# Patient Record
Sex: Male | Born: 1955 | Race: White | Hispanic: No | Marital: Married | Smoking: Current every day smoker
Health system: Southern US, Community
[De-identification: ages and names within clinical notes are randomized; demographics above are authoritative.]

## PROBLEM LIST (undated history)

## (undated) DIAGNOSIS — E785 Hyperlipidemia, unspecified: Secondary | ICD-10-CM

## (undated) DIAGNOSIS — J449 Chronic obstructive pulmonary disease, unspecified: Secondary | ICD-10-CM

## (undated) DIAGNOSIS — N200 Calculus of kidney: Secondary | ICD-10-CM

## (undated) DIAGNOSIS — W3400XA Accidental discharge from unspecified firearms or gun, initial encounter: Secondary | ICD-10-CM

## (undated) DIAGNOSIS — T7840XA Allergy, unspecified, initial encounter: Secondary | ICD-10-CM

## (undated) DIAGNOSIS — R569 Unspecified convulsions: Secondary | ICD-10-CM

## (undated) DIAGNOSIS — K219 Gastro-esophageal reflux disease without esophagitis: Secondary | ICD-10-CM

## (undated) DIAGNOSIS — F17200 Nicotine dependence, unspecified, uncomplicated: Secondary | ICD-10-CM

## (undated) DIAGNOSIS — M1990 Unspecified osteoarthritis, unspecified site: Secondary | ICD-10-CM

## (undated) DIAGNOSIS — I739 Peripheral vascular disease, unspecified: Secondary | ICD-10-CM

## (undated) DIAGNOSIS — S0193XA Puncture wound without foreign body of unspecified part of head, initial encounter: Secondary | ICD-10-CM

## (undated) DIAGNOSIS — I10 Essential (primary) hypertension: Secondary | ICD-10-CM

## (undated) DIAGNOSIS — C801 Malignant (primary) neoplasm, unspecified: Secondary | ICD-10-CM

## (undated) DIAGNOSIS — M795 Residual foreign body in soft tissue: Secondary | ICD-10-CM

## (undated) HISTORY — PX: POLYPECTOMY: SHX149

## (undated) HISTORY — DX: Malignant (primary) neoplasm, unspecified: C80.1

## (undated) HISTORY — DX: Gastro-esophageal reflux disease without esophagitis: K21.9

## (undated) HISTORY — DX: Puncture wound without foreign body of unspecified part of head, initial encounter: S01.93XA

## (undated) HISTORY — DX: Unspecified osteoarthritis, unspecified site: M19.90

## (undated) HISTORY — DX: Nicotine dependence, unspecified, uncomplicated: F17.200

## (undated) HISTORY — DX: Calculus of kidney: N20.0

## (undated) HISTORY — DX: Allergy, unspecified, initial encounter: T78.40XA

## (undated) HISTORY — DX: Unspecified convulsions: R56.9

## (undated) HISTORY — DX: Peripheral vascular disease, unspecified: I73.9

## (undated) HISTORY — DX: Hyperlipidemia, unspecified: E78.5

## (undated) HISTORY — DX: Chronic obstructive pulmonary disease, unspecified: J44.9

## (undated) HISTORY — DX: Residual foreign body in soft tissue: M79.5

## (undated) HISTORY — PX: NASAL SINUS SURGERY: SHX719

## (undated) HISTORY — DX: Accidental discharge from unspecified firearms or gun, initial encounter: W34.00XA

## (undated) HISTORY — PX: COLONOSCOPY: SHX174

---

## 1988-12-30 HISTORY — PX: SALIVARY GLAND SURGERY: SHX768

## 2004-10-31 ENCOUNTER — Ambulatory Visit: Payer: Self-pay | Admitting: Internal Medicine

## 2005-02-12 ENCOUNTER — Ambulatory Visit: Payer: Self-pay | Admitting: Internal Medicine

## 2006-04-22 ENCOUNTER — Ambulatory Visit: Payer: Self-pay | Admitting: Internal Medicine

## 2006-05-05 ENCOUNTER — Ambulatory Visit: Payer: Self-pay | Admitting: Internal Medicine

## 2006-06-16 ENCOUNTER — Ambulatory Visit: Payer: Self-pay | Admitting: Internal Medicine

## 2006-10-21 ENCOUNTER — Ambulatory Visit: Payer: Self-pay | Admitting: Internal Medicine

## 2006-12-24 ENCOUNTER — Ambulatory Visit: Payer: Self-pay | Admitting: Internal Medicine

## 2006-12-24 LAB — CONVERTED CEMR LAB
BUN: 13 mg/dL (ref 6–23)
CO2: 29 meq/L (ref 19–32)
Calcium: 9.5 mg/dL (ref 8.4–10.5)
Chloride: 105 meq/L (ref 96–112)
Creatinine, Ser: 0.9 mg/dL (ref 0.4–1.5)
GFR calc non Af Amer: 96 mL/min
Glomerular Filtration Rate, Af Am: 116 mL/min/{1.73_m2}
Glucose, Bld: 102 mg/dL — ABNORMAL HIGH (ref 70–99)
Potassium: 4.5 meq/L (ref 3.5–5.1)
Sodium: 141 meq/L (ref 135–145)

## 2007-05-01 ENCOUNTER — Ambulatory Visit: Payer: Self-pay | Admitting: Internal Medicine

## 2007-05-01 LAB — CONVERTED CEMR LAB
ALT: 26 units/L (ref 0–40)
AST: 23 units/L (ref 0–37)
Albumin: 4.2 g/dL (ref 3.5–5.2)
Alkaline Phosphatase: 54 units/L (ref 39–117)
BUN: 10 mg/dL (ref 6–23)
Basophils Absolute: 0.1 10*3/uL (ref 0.0–0.1)
Basophils Relative: 1.1 % — ABNORMAL HIGH (ref 0.0–1.0)
Bilirubin, Direct: 0.1 mg/dL (ref 0.0–0.3)
CO2: 30 meq/L (ref 19–32)
Calcium: 9.6 mg/dL (ref 8.4–10.5)
Chloride: 107 meq/L (ref 96–112)
Cholesterol: 157 mg/dL (ref 0–200)
Creatinine, Ser: 1 mg/dL (ref 0.4–1.5)
Eosinophils Absolute: 0.6 10*3/uL (ref 0.0–0.6)
Eosinophils Relative: 6.7 % — ABNORMAL HIGH (ref 0.0–5.0)
GFR calc Af Amer: 103 mL/min
GFR calc non Af Amer: 85 mL/min
Glucose, Bld: 81 mg/dL (ref 70–99)
HCT: 44 % (ref 39.0–52.0)
HDL: 38.9 mg/dL — ABNORMAL LOW (ref >39.0)
Hemoglobin: 15.2 g/dL (ref 13.0–17.0)
LDL Cholesterol: 95 mg/dL (ref 0–99)
Lymphocytes Relative: 25.1 % (ref 12.0–46.0)
MCHC: 34.5 g/dL (ref 30.0–36.0)
MCV: 95.3 fL (ref 78.0–100.0)
Monocytes Absolute: 0.8 10*3/uL — ABNORMAL HIGH (ref 0.2–0.7)
Monocytes Relative: 8.6 % (ref 3.0–11.0)
Neutro Abs: 5.1 10*3/uL (ref 1.4–7.7)
Neutrophils Relative %: 58.5 % (ref 43.0–77.0)
Platelets: 187 10*3/uL (ref 150–400)
Potassium: 4 meq/L (ref 3.5–5.1)
RBC: 4.62 M/uL (ref 4.22–5.81)
RDW: 12.4 % (ref 11.5–14.6)
Sodium: 146 meq/L — ABNORMAL HIGH (ref 135–145)
TSH: 1.79 microintl units/mL (ref 0.35–5.50)
Total Bilirubin: 0.6 mg/dL (ref 0.3–1.2)
Total CHOL/HDL Ratio: 4
Total Protein: 7.2 g/dL (ref 6.0–8.3)
Triglycerides: 116 mg/dL (ref 0–149)
VLDL: 23 mg/dL (ref 0–40)
WBC: 8.8 10*3/uL (ref 4.5–10.5)

## 2007-05-08 ENCOUNTER — Ambulatory Visit: Payer: Self-pay | Admitting: Internal Medicine

## 2007-06-29 ENCOUNTER — Ambulatory Visit: Payer: Self-pay | Admitting: Internal Medicine

## 2007-06-30 LAB — HM COLONOSCOPY

## 2007-07-10 ENCOUNTER — Encounter: Payer: Self-pay | Admitting: Internal Medicine

## 2007-07-10 ENCOUNTER — Ambulatory Visit: Payer: Self-pay | Admitting: Internal Medicine

## 2007-11-04 ENCOUNTER — Telehealth: Payer: Self-pay | Admitting: Internal Medicine

## 2008-03-28 ENCOUNTER — Ambulatory Visit: Payer: Self-pay | Admitting: Internal Medicine

## 2008-03-28 DIAGNOSIS — I10 Essential (primary) hypertension: Secondary | ICD-10-CM | POA: Insufficient documentation

## 2008-04-01 ENCOUNTER — Telehealth: Payer: Self-pay | Admitting: Internal Medicine

## 2008-04-01 ENCOUNTER — Ambulatory Visit: Payer: Self-pay | Admitting: Internal Medicine

## 2008-04-15 ENCOUNTER — Ambulatory Visit: Payer: Self-pay | Admitting: Internal Medicine

## 2008-05-09 ENCOUNTER — Telehealth: Payer: Self-pay | Admitting: Internal Medicine

## 2008-07-08 ENCOUNTER — Ambulatory Visit: Payer: Self-pay | Admitting: Internal Medicine

## 2008-07-11 ENCOUNTER — Telehealth: Payer: Self-pay | Admitting: Internal Medicine

## 2008-08-03 ENCOUNTER — Ambulatory Visit: Payer: Self-pay | Admitting: Internal Medicine

## 2008-08-16 ENCOUNTER — Telehealth: Payer: Self-pay | Admitting: Internal Medicine

## 2008-08-17 ENCOUNTER — Ambulatory Visit: Payer: Self-pay | Admitting: Internal Medicine

## 2008-09-19 ENCOUNTER — Ambulatory Visit: Payer: Self-pay | Admitting: Internal Medicine

## 2008-09-19 ENCOUNTER — Encounter: Payer: Self-pay | Admitting: Internal Medicine

## 2008-10-17 ENCOUNTER — Telehealth: Payer: Self-pay | Admitting: Internal Medicine

## 2008-12-09 ENCOUNTER — Ambulatory Visit: Payer: Self-pay | Admitting: Internal Medicine

## 2009-08-11 ENCOUNTER — Ambulatory Visit: Payer: Self-pay | Admitting: Internal Medicine

## 2009-09-12 ENCOUNTER — Ambulatory Visit: Payer: Self-pay | Admitting: Internal Medicine

## 2009-09-12 DIAGNOSIS — F17200 Nicotine dependence, unspecified, uncomplicated: Secondary | ICD-10-CM | POA: Insufficient documentation

## 2009-09-12 HISTORY — DX: Nicotine dependence, unspecified, uncomplicated: F17.200

## 2009-09-14 ENCOUNTER — Ambulatory Visit: Payer: Self-pay

## 2009-09-14 ENCOUNTER — Encounter: Payer: Self-pay | Admitting: Internal Medicine

## 2009-09-15 ENCOUNTER — Ambulatory Visit: Payer: Self-pay | Admitting: Internal Medicine

## 2010-03-01 ENCOUNTER — Ambulatory Visit: Payer: Self-pay | Admitting: Internal Medicine

## 2010-03-02 LAB — CONVERTED CEMR LAB
BUN: 17 mg/dL (ref 6–23)
Basophils Relative: 0.1 % (ref 0.0–3.0)
Bilirubin, Direct: 0.1 mg/dL (ref 0.0–0.3)
Cholesterol: 145 mg/dL (ref 0–200)
Eosinophils Absolute: 0.6 10*3/uL (ref 0.0–0.7)
GFR calc non Af Amer: 108.01 mL/min (ref >60)
Glucose, Bld: 90 mg/dL (ref 70–99)
HDL: 42.5 mg/dL (ref >39.00)
LDL Cholesterol: 90 mg/dL (ref 0–99)
MCHC: 34 g/dL (ref 30.0–36.0)
MCV: 98.4 fL (ref 78.0–100.0)
Monocytes Absolute: 0.9 10*3/uL (ref 0.1–1.0)
Neutro Abs: 7.5 10*3/uL (ref 1.4–7.7)
Neutrophils Relative %: 66.6 % (ref 43.0–77.0)
RBC: 4.21 M/uL — ABNORMAL LOW (ref 4.22–5.81)
RDW: 12.4 % (ref 11.5–14.6)
Sodium: 144 meq/L (ref 135–145)
TSH: 1.76 microintl units/mL (ref 0.35–5.50)
Total CHOL/HDL Ratio: 3
Total Protein: 7.7 g/dL (ref 6.0–8.3)
Triglycerides: 61 mg/dL (ref 0.0–149.0)

## 2010-03-07 ENCOUNTER — Ambulatory Visit: Payer: Self-pay | Admitting: Internal Medicine

## 2010-04-03 ENCOUNTER — Encounter: Payer: Self-pay | Admitting: Internal Medicine

## 2010-06-18 ENCOUNTER — Ambulatory Visit: Payer: Self-pay | Admitting: Internal Medicine

## 2010-07-11 ENCOUNTER — Ambulatory Visit: Payer: Self-pay | Admitting: Internal Medicine

## 2010-07-27 ENCOUNTER — Telehealth: Payer: Self-pay | Admitting: Internal Medicine

## 2010-10-04 ENCOUNTER — Encounter: Payer: Self-pay | Admitting: Internal Medicine

## 2010-11-28 ENCOUNTER — Ambulatory Visit: Payer: Self-pay | Admitting: Internal Medicine

## 2010-11-28 DIAGNOSIS — J019 Acute sinusitis, unspecified: Secondary | ICD-10-CM | POA: Insufficient documentation

## 2011-01-10 ENCOUNTER — Ambulatory Visit
Admission: RE | Admit: 2011-01-10 | Discharge: 2011-01-10 | Payer: Self-pay | Source: Home / Self Care | Attending: Family Medicine | Admitting: Family Medicine

## 2011-01-20 ENCOUNTER — Encounter: Payer: Self-pay | Admitting: Internal Medicine

## 2011-01-29 NOTE — Procedures (Signed)
Summary: Colon/[facility]  Colon/[facility]   Imported By: Lester Sayner 03/02/2010 08:41:23  _____________________________________________________________________  External Attachment:    Type:   Image     Comment:   External Document
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
## 2011-01-29 NOTE — Consult Note (Signed)
Summary: [facility]  [facility]   Imported By: Maryln Gottron 04/18/2010 14:38:07  _____________________________________________________________________  External Attachment:    Type:   Image     Comment:   External Document

## 2011-01-29 NOTE — Assessment & Plan Note (Signed)
Summary: POST HOSP F/U (HTN, CP) // RS   Vital Signs:  Patient profile:   55 year old male Weight:      210 pounds Temp:     98.4 degrees F oral Pulse rate:   84 / minute Pulse rhythm:   regular Resp:     12 per minute BP sitting:   124 / 78  (left arm) Cuff size:   regular  Vitals Entered By: Gladis Riffle, RN (July 11, 2010 8:53 AM) CC: hospital FU-was in [facility] for chest pain, only stayed overnight Is Patient Diabetic? No   CC:  hospital FU-was in [facility] for chest pain and only stayed overnight.  History of Present Illness:  Follow-Up Visit      This is a 55 year old man who presents for Follow-up visit.  The patient denies chest pain (except for hospitalization) and palpitations.  Since the last visit the patient notes a recent hospitilization.  The patient reports taking meds as prescribed.  When questioned about possible medication side effects, the patient notes none.   he does not know the results of hospitalization----we have called for results of hospital labs. says he had stress test at the hospital---says results normal  he currently feels well---multiple risk factors for CAD  All other systems reviewed and were negative   Preventive Screening-Counseling & Management  Alcohol-Tobacco     Smoking Status: current     Smoking Cessation Counseling: yes     Packs/Day: 1.5  Current Medications (verified): 1)  Benicar Hct 40-25 Mg  Tabs (Olmesartan Medoxomil-Hctz) .... Take 1/2 Tab By Mouth Daily 2)  Nystatin-Triamcinolone 100000-0.1 Unit/gm-% Crea (Nystatin-Triamcinolone) .... Apply Two Times A Day As Needed  Allergies: 1)  ! Lisinopril (Lisinopril)  Physical Exam  General:  Well-developed,well-nourished,in no acute distress; alert,appropriate and cooperative throughout examination; blood pressure 140/84 Head:  normocephalic and atraumatic.   Eyes:  pupils equal and pupils round.   Ears:  R ear normal and L ear normal.   Neck:  No  deformities, masses, or tenderness noted. no bruits Chest Wall:  No deformities, masses, tenderness or gynecomastia noted. Lungs:  normal respiratory effort and no intercostal retractions.   Heart:  normal rate and regular rhythm.   Abdomen:  Bowel sounds positive,abdomen soft and non-tender without masses, organomegaly or hernias noted. Msk:  normal ROM and no joint tenderness.   Neurologic:  alert & oriented X3 and cranial nerves II-XII intact.     Impression & Recommendations:  Problem # 1:  CHEST PAIN (ICD-786.50) required hospitalization will get hospital records I'll review and decide whether further evaluation necessary  Complete Medication List: 1)  Benicar Hct 40-25 Mg Tabs (Olmesartan medoxomil-hctz) .... Take 1/2 tab by mouth daily 2)  Nystatin-triamcinolone 100000-0.1 Unit/gm-% Crea (Nystatin-triamcinolone) .... Apply two times a day as needed 3)  Aspirin 325 Mg Tabs (Aspirin) .... Take 1 tab by mouth every day  Patient Instructions: 1)  release of information for hospitalziation
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
## 2011-01-29 NOTE — Letter (Signed)
Summary: [facility]  [facility]   Imported By: Maryln Gottron 10/12/2010 15:53:14  _____________________________________________________________________  External Attachment:    Type:   Image     Comment:   External Document

## 2011-01-29 NOTE — Assessment & Plan Note (Signed)
Summary: CPX/PT FASTING/CJR   Vital Signs:  Patient profile:   55 year old male Height:      76 inches Weight:      212 pounds BMI:     25.90 Pulse rate:   80 / minute Pulse rhythm:   regular Resp:     14 per minute BP sitting:   138 / 82  (left arm) Cuff size:   regular  Vitals Entered By: Gladis Riffle, RN (March 01, 2010 8:08 AM) CC: cpx, fasting Is Patient Diabetic? No   CC:  cpx and fasting.  Preventive Screening-Counseling & Management  Alcohol-Tobacco     Smoking Status: current     Packs/Day: 1.5  Current Medications (verified): 1)  Benicar Hct 40-25 Mg  Tabs (Olmesartan Medoxomil-Hctz) .... Take 1/2 Tab By Mouth Daily 2)  Nystatin-Triamcinolone 100000-0.1 Unit/gm-% Crea (Nystatin-Triamcinolone) .... Apply Two Times A Day As Needed  Allergies: 1)  ! Lisinopril (Lisinopril)  Social History: Packs/Day:  1.5   Impression & Recommendations:  Problem # 1:  PREVENTIVE HEALTH CARE (ICD-V70.0) helath maint UTD smoking cessation discussed---he is not ready to commit advised daily exercise Orders: UA Dipstick w/Micro (automated) (81001) Venipuncture (16109) TLB-Lipid Panel (80061-LIPID) TLB-BMP (Basic Metabolic Panel-BMET) (80048-METABOL) TLB-CBC Platelet - w/Differential (85025-CBCD) TLB-Hepatic/Liver Function Pnl (80076-HEPATIC) TLB-TSH (Thyroid Stimulating Hormone) (84443-TSH) TLB-PSA (Prostate Specific Antigen) (84153-PSA)  Complete Medication List: 1)  Benicar Hct 40-25 Mg Tabs (Olmesartan medoxomil-hctz) .... Take 1/2 tab by mouth daily 2)  Nystatin-triamcinolone 100000-0.1 Unit/gm-% Crea (Nystatin-triamcinolone) .... Apply two times a day as needed  Patient Instructions: 1)  Please schedule a follow-up appointment in 6 months.   Immunization History:  Tetanus/Td Immunization History:    Tetanus/Td:  historical (12/30/2004)    Preventive Care Screening  Colonoscopy:    Date:  06/29/2004    Next Due:  06/2009    Results:  polyps       Preventive Care Screening  Colonoscopy:    Date:  06/29/2004    Next Due:  06/2009    Results:  polyps   Physical Exam General Appearance: well developed, well nourished, no acute distress Eyes: conjunctiva and lids normal, PERRL, EOMI, Ears, Nose, Mouth, Throat: TM clear, nares clear, oral exam WNL Neck: supple, no lymphadenopathy, no thyromegaly, no JVD Respiratory: clear to auscultation and percussion, respiratory effort normal Cardiovascular: regular rate and rhythm, S1-S2, no murmur, rub or gallop, no bruits, peripheral pulses normal and symmetric, no cyanosis, clubbing, edema or varicosities Chest: no scars, masses, tenderness; no asymmetry, skin changes, nipple discharge, no gynecomastia   Gastrointestinal: soft, non-tender; no hepatosplenomegaly, masses; active bowel sounds all quadrants,  no masses, tenderness, hemorrhoids  Genitourinary: no  prostate enlargement Lymphatic: no cervical, axillary or inguinal adenopathy Musculoskeletal: gait normal, muscle tone and strength WNL, no joint swelling, effusions, discoloration, crepitus  Skin: clear, good turgor, color WNL, no rashes, lesions, or ulcerations Neurologic: normal mental status, normal reflexes, normal strength, sensation, and motion Psychiatric: alert; oriented to person, place and time Other Exam:   Appended Document: Orders Update     Clinical Lists Changes  Observations: Added new observation of COMMENTS: Wynona Canes, CMA  March 01, 2010 11:59 AM  (03/01/2010 11:58) Added new observation of PH URINE: 6.0  (03/01/2010 11:58) Added new observation of SPEC GR URIN: 1.025  (03/01/2010 11:58) Added new observation of APPEARANCE U: Hazy  (03/01/2010 11:58) Added new observation of UA COLOR: yellow  (03/01/2010 11:58) Added new observation of WBC DIPSTK U: negative  (03/01/2010  11:58) Added new observation of NITRITE URN: negative  (03/01/2010 11:58) Added new observation of UROBILINOGEN: 0.2   (03/01/2010 11:58) Added new observation of PROTEIN, URN: 1+  (03/01/2010 11:58) Added new observation of BLOOD UR DIP: 2+  (03/01/2010 11:58) Added new observation of KETONES URN: negative  (03/01/2010 11:58) Added new observation of BILIRUBIN UR: 1+  (03/01/2010 11:58) Added new observation of GLUCOSE, URN: negative  (03/01/2010 11:58)      Laboratory Results   Urine Tests  Date/Time Recieved: March 01, 2010 11:59 AM  Date/Time Reported: March 01, 2010 11:59 AM   Routine Urinalysis   Color: yellow Appearance: Hazy Glucose: negative   (Normal Range: Negative) Bilirubin: 1+   (Normal Range: Negative) Ketone: negative   (Normal Range: Negative) Spec. Gravity: 1.025   (Normal Range: 1.003-1.035) Blood: 2+   (Normal Range: Negative) pH: 6.0   (Normal Range: 5.0-8.0) Protein: 1+   (Normal Range: Negative) Urobilinogen: 0.2   (Normal Range: 0-1) Nitrite: negative   (Normal Range: Negative) Leukocyte Esterace: negative   (Normal Range: Negative)    Comments: Wynona Canes, CMA  March 01, 2010 11:59 AM     Appended Document: CPX/PT FASTING/CJR call patient. has some blood in urine check CT abdomen and pelvis---with contrast---hematuria  Appended Document: CPX/PT FASTING/CJR Left message on machine. Pt to call back.   Appended Document: CPX/PT FASTING/CJR Patient notified. Will await when and where of scan.  order in process

## 2011-01-29 NOTE — Progress Notes (Signed)
Summary: REVIEW OF PAPERWORK?  Phone Note Call from Patient   Caller: Spouse   [phone] Summary of Call: Pts wife calling to inquire if Dr Cato Mulligan has had the opportunity to look over paperwork from [facility] (where pt was admitted ref HTN / CP)..... ?  Advise that she is anxious to find out if there is anything that the pt should or should not be doing.....?    Pts wife can be reached at [phone]  to advise  /  if you have any concerns or questions.  Initial call taken by: Debbra Riding,  July 27, 2010 9:38 AM  Follow-up for Phone Call        reviewed no concerns Follow-up by: Birdie Sons MD,  July 27, 2010 3:51 PM  Additional Follow-up for Phone Call Additional follow up Details #1::        Lft msg on v/m for pts wife... adv to c/b if needed.   Additional Follow-up by: Debbra Riding,  July 27, 2010 3:58 PM
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
## 2011-02-08 ENCOUNTER — Other Ambulatory Visit: Payer: Self-pay | Admitting: Internal Medicine

## 2011-02-22 ENCOUNTER — Other Ambulatory Visit (INDEPENDENT_AMBULATORY_CARE_PROVIDER_SITE_OTHER): Payer: PRIVATE HEALTH INSURANCE | Admitting: Internal Medicine

## 2011-02-22 ENCOUNTER — Ambulatory Visit (INDEPENDENT_AMBULATORY_CARE_PROVIDER_SITE_OTHER): Payer: PRIVATE HEALTH INSURANCE | Admitting: Family Medicine

## 2011-02-22 ENCOUNTER — Encounter: Payer: Self-pay | Admitting: Family Medicine

## 2011-02-22 VITALS — BP 126/78 | Temp 98.1°F | Wt 218.0 lb

## 2011-02-22 DIAGNOSIS — H4900 Third [oculomotor] nerve palsy, unspecified eye: Secondary | ICD-10-CM

## 2011-02-22 DIAGNOSIS — Z0000 Encounter for general adult medical examination without abnormal findings: Secondary | ICD-10-CM

## 2011-02-22 DIAGNOSIS — H02409 Unspecified ptosis of unspecified eyelid: Secondary | ICD-10-CM

## 2011-02-22 LAB — CBC WITH DIFFERENTIAL/PLATELET
Basophils Absolute: 0 10*3/uL (ref 0.0–0.1)
Basophils Relative: 0.4 % (ref 0.0–3.0)
Eosinophils Absolute: 0.6 10*3/uL (ref 0.0–0.7)
Lymphocytes Relative: 27.1 % (ref 12.0–46.0)
MCHC: 34.7 g/dL (ref 30.0–36.0)
MCV: 97.9 fl (ref 78.0–100.0)
Monocytes Absolute: 0.7 10*3/uL (ref 0.1–1.0)
Neutrophils Relative %: 57.8 % (ref 43.0–77.0)
Platelets: 180 10*3/uL (ref 150.0–400.0)
RDW: 13.3 % (ref 11.5–14.6)

## 2011-02-22 LAB — LIPID PANEL
HDL: 40.3 mg/dL (ref >39.00)
Total CHOL/HDL Ratio: 4
Triglycerides: 74 mg/dL (ref 0.0–149.0)
VLDL: 14.8 mg/dL (ref 0.0–40.0)

## 2011-02-22 LAB — BASIC METABOLIC PANEL
BUN: 15 mg/dL (ref 6–23)
CO2: 27 mEq/L (ref 19–32)
Chloride: 109 mEq/L (ref 96–112)
GFR: 95.15 mL/min (ref >60.00)

## 2011-02-22 LAB — HEPATIC FUNCTION PANEL
Bilirubin, Direct: 0.2 mg/dL (ref 0.0–0.3)
Total Bilirubin: 1 mg/dL (ref 0.3–1.2)
Total Protein: 7.4 g/dL (ref 6.0–8.3)

## 2011-02-22 LAB — POCT URINALYSIS DIPSTICK
Blood, UA: NEGATIVE
Ketones, UA: NEGATIVE
Spec Grav, UA: 1.025
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
## 2011-02-22 NOTE — Patient Instructions (Signed)
We will call you regarding CT head.

## 2011-02-24 ENCOUNTER — Encounter: Payer: Self-pay | Admitting: Family Medicine

## 2011-02-24 DIAGNOSIS — H4900 Third [oculomotor] nerve palsy, unspecified eye: Secondary | ICD-10-CM | POA: Insufficient documentation

## 2011-02-26 ENCOUNTER — Other Ambulatory Visit: Payer: Self-pay | Admitting: Family Medicine

## 2011-02-26 ENCOUNTER — Ambulatory Visit (INDEPENDENT_AMBULATORY_CARE_PROVIDER_SITE_OTHER)
Admission: RE | Admit: 2011-02-26 | Discharge: 2011-02-26 | Disposition: A | Discharge status: Home / Self Care | Payer: PRIVATE HEALTH INSURANCE | Source: Ambulatory Visit | Attending: Family Medicine | Admitting: Family Medicine

## 2011-02-26 DIAGNOSIS — H4900 Third [oculomotor] nerve palsy, unspecified eye: Secondary | ICD-10-CM

## 2011-02-26 HISTORY — DX: Essential (primary) hypertension: I10

## 2011-02-26 MED ORDER — IOHEXOL 300 MG/ML  SOLN
80.0000 mL | Freq: Once | INTRAMUSCULAR | Status: AC | PRN
  Administered 2011-02-26: 80 mL via INTRAVENOUS

## 2011-02-28 ENCOUNTER — Telehealth: Payer: Self-pay | Admitting: *Deleted

## 2011-02-28 NOTE — Progress Notes (Signed)
Quick Note:  LMTCB ______ 

## 2011-03-01 ENCOUNTER — Telehealth: Payer: Self-pay | Admitting: *Deleted

## 2011-03-01 NOTE — Progress Notes (Signed)
Quick Note:  Pt informed on home personally identified VM 2 times ______

## 2011-03-01 NOTE — Progress Notes (Signed)
Quick Note:  Pt informed 2 times on personally identified VM ______

## 2011-03-04 ENCOUNTER — Encounter: Payer: Self-pay | Admitting: Internal Medicine

## 2011-03-04 ENCOUNTER — Ambulatory Visit (INDEPENDENT_AMBULATORY_CARE_PROVIDER_SITE_OTHER): Payer: PRIVATE HEALTH INSURANCE | Admitting: Internal Medicine

## 2011-03-04 DIAGNOSIS — I779 Disorder of arteries and arterioles, unspecified: Secondary | ICD-10-CM | POA: Insufficient documentation

## 2011-03-04 DIAGNOSIS — E785 Hyperlipidemia, unspecified: Secondary | ICD-10-CM

## 2011-03-04 DIAGNOSIS — Z0000 Encounter for general adult medical examination without abnormal findings: Secondary | ICD-10-CM

## 2011-03-04 DIAGNOSIS — H4900 Third [oculomotor] nerve palsy, unspecified eye: Secondary | ICD-10-CM

## 2011-03-04 HISTORY — DX: Disorder of arteries and arterioles, unspecified: I77.9

## 2011-03-04 HISTORY — DX: Hyperlipidemia, unspecified: E78.5

## 2011-03-04 MED ORDER — SIMVASTATIN 20 MG PO TABS: 20.0000 mg | ORAL_TABLET | Freq: Every evening | ORAL | Status: DC

## 2011-03-04 NOTE — Assessment & Plan Note (Signed)
Needs statin therapy and risk factor modification Stop smoking

## 2011-03-04 NOTE — Assessment & Plan Note (Signed)
Continues to have left eyelid close spontaneously---refer neurology

## 2011-03-04 NOTE — Assessment & Plan Note (Signed)
Needs treatement Side effects discussed

## 2011-03-04 NOTE — Progress Notes (Addendum)
  Subjective:    Patient ID: Darin Hunter, male    DOB: 1956/04/15, 55 y.o.   MRN: [id number]  HPI CPX  Past Medical History  Diagnosis Date  . Hypertension    No past surgical history on file.  reports that he has been smoking.  He does not have any smokeless tobacco history on file. His alcohol and drug histories not on file. family history is not on file.    Allergies  Allergen Reactions  . Lisinopril     REACTION: facial swelling?    Review of Systems  patient denies chest pain, shortness of breath, orthopnea. Denies lower extremity edema, abdominal pain, change in appetite, change in bowel movements. Patient denies rashes, musculoskeletal complaints. No other specific complaints in a complete review of systems.      Objective:   Physical Exam well-developed well-nourished male in no acute distress. HEENT exam atraumatic, normocephalic, neck supple without jugular venous distention. Chest clear to auscultation cardiac exam S1-S2 are regular. Abdominal exam overweight with bowel sounds, soft and nontender. Extremities no edema. Neurologic exam is alert with a normal gait.    Assessment & Plan:  Well visit Needs to quit smoking He has several other medical problems---adressed individually

## 2011-03-14 NOTE — Telephone Encounter (Signed)
Opened encounter by mistake.

## 2011-06-04 ENCOUNTER — Other Ambulatory Visit (INDEPENDENT_AMBULATORY_CARE_PROVIDER_SITE_OTHER): Payer: PRIVATE HEALTH INSURANCE

## 2011-06-04 DIAGNOSIS — E785 Hyperlipidemia, unspecified: Secondary | ICD-10-CM

## 2011-06-04 LAB — LIPID PANEL: Cholesterol: 134 mg/dL (ref 0–200)

## 2011-06-04 LAB — HEPATIC FUNCTION PANEL
ALT: 22 U/L (ref 0–53)
Alkaline Phosphatase: 55 U/L (ref 39–117)
Bilirubin, Direct: 0.2 mg/dL (ref 0.0–0.3)
Total Protein: 7.4 g/dL (ref 6.0–8.3)

## 2011-06-11 ENCOUNTER — Encounter: Payer: Self-pay | Admitting: Internal Medicine

## 2011-06-11 ENCOUNTER — Ambulatory Visit (INDEPENDENT_AMBULATORY_CARE_PROVIDER_SITE_OTHER): Payer: PRIVATE HEALTH INSURANCE | Admitting: Internal Medicine

## 2011-06-11 DIAGNOSIS — E785 Hyperlipidemia, unspecified: Secondary | ICD-10-CM

## 2011-06-11 DIAGNOSIS — I779 Disorder of arteries and arterioles, unspecified: Secondary | ICD-10-CM

## 2011-06-11 DIAGNOSIS — I10 Essential (primary) hypertension: Secondary | ICD-10-CM

## 2011-06-11 DIAGNOSIS — F17200 Nicotine dependence, unspecified, uncomplicated: Secondary | ICD-10-CM

## 2011-06-11 LAB — POCT URINALYSIS DIPSTICK
Blood, UA: NEGATIVE
Protein, UA: NEGATIVE
Spec Grav, UA: 1.01
Urobilinogen, UA: 0.2

## 2011-06-11 MED ORDER — CIPROFLOXACIN HCL 500 MG PO TABS: 500.0000 mg | ORAL_TABLET | Freq: Two times a day (BID) | ORAL | Status: AC

## 2011-06-11 MED ORDER — NYSTATIN-TRIAMCINOLONE 100000-0.1 UNIT/GM-% EX CREA: TOPICAL_CREAM | Freq: Two times a day (BID) | CUTANEOUS | Status: DC

## 2011-06-11 NOTE — Progress Notes (Signed)
  Subjective:    Patient ID: Darin Hunter, male    DOB: 11-17-1956, 55 y.o.   MRN: [id number]  HPI  Patient Active Problem List  Diagnoses  . TOBACCO USER---not interested in quitting--  . HYPERTENSION---tolerating meds  . Oculomotor nerve palsy--sxs resolved  . Carotid arterial disease--no associated sxs  . Hyperlipemia---tolerating meds without difficulty   New complaint 3-4 days of dysuria--no fever or chills  Past Medical History  Diagnosis Date  . Hypertension    No past surgical history on file.  reports that he has been smoking.  He does not have any smokeless tobacco history on file. He reports that he drinks about 8.4 ounces of alcohol per week. He reports that he does not use illicit drugs. family history includes Cancer in his maternal grandfather and mother. Allergies  Allergen Reactions  . Lisinopril     REACTION: facial swelling?   Review of Systems  patient denies chest pain, shortness of breath, orthopnea. Denies lower extremity edema, abdominal pain, change in appetite, change in bowel movements. Patient denies rashes, musculoskeletal complaints. No other specific complaints in a complete review of systems.      Objective:   Physical Exam  well-developed well-nourished male in no acute distress. HEENT exam atraumatic, normocephalic, neck supple without jugular venous distention. Chest clear to auscultation cardiac exam S1-S2 are regular. Abdominal exam overweight with bowel sounds, soft and nontender. Extremities no edema. Neurologic exam is alert with a normal gait.        Assessment & Plan:  New problem---urinary sxs- check ua today

## 2011-06-11 NOTE — Assessment & Plan Note (Signed)
He understands need to quit He is not interested at this time

## 2011-06-11 NOTE — Assessment & Plan Note (Signed)
Controlled Continue current meds 

## 2011-06-11 NOTE — Assessment & Plan Note (Signed)
Adequate control. Continue current meds.  

## 2011-06-11 NOTE — Progress Notes (Signed)
Addended by: Alfred Levins D on: 06/11/2011 11:16 AM   Modules accepted: Orders

## 2011-06-11 NOTE — Assessment & Plan Note (Signed)
Reviewed ultrasound Needs continued risk factor modification

## 2011-06-13 LAB — URINE CULTURE: Organism ID, Bacteria: NO GROWTH

## 2011-06-19 ENCOUNTER — Other Ambulatory Visit: Payer: Self-pay | Admitting: Internal Medicine

## 2011-06-19 NOTE — Telephone Encounter (Signed)
Pt had req refill of BENICAR HCT 40-25 MG per tablet on Monday 06/17/11 and have not gotten response. Pls call in to [facility] in [place], [place] [phone]

## 2011-06-20 MED ORDER — OLMESARTAN MEDOXOMIL-HCTZ 40-25 MG PO TABS: 1.0000 | ORAL_TABLET | Freq: Every day | ORAL | Status: DC

## 2011-06-20 NOTE — Telephone Encounter (Signed)
rx sent in electronically 

## 2011-07-09 ENCOUNTER — Telehealth: Payer: Self-pay | Admitting: Internal Medicine

## 2011-07-09 DIAGNOSIS — R300 Dysuria: Secondary | ICD-10-CM

## 2011-07-09 NOTE — Telephone Encounter (Signed)
Not able to urinate in the past 2 days and he saw Dr Cato Mulligan a awhile a go and Dr Cato Mulligan told him if it happens again, that he will need to see an urologist. Please refer.

## 2011-07-09 NOTE — Telephone Encounter (Signed)
Order placed for referral.  

## 2011-07-16 ENCOUNTER — Encounter: Payer: Self-pay | Admitting: Speech Pathology

## 2011-10-17 ENCOUNTER — Encounter: Payer: Self-pay | Admitting: Family Medicine

## 2011-10-17 ENCOUNTER — Ambulatory Visit (INDEPENDENT_AMBULATORY_CARE_PROVIDER_SITE_OTHER): Payer: PRIVATE HEALTH INSURANCE | Admitting: Family Medicine

## 2011-10-17 ENCOUNTER — Ambulatory Visit: Payer: PRIVATE HEALTH INSURANCE | Admitting: Family Medicine

## 2011-10-17 VITALS — BP 130/72 | Temp 98.2°F | Wt 216.0 lb

## 2011-10-17 DIAGNOSIS — J45909 Unspecified asthma, uncomplicated: Secondary | ICD-10-CM

## 2011-10-17 MED ORDER — HYDROCODONE-HOMATROPINE 5-1.5 MG/5ML PO SYRP: 5.0000 mL | ORAL_SOLUTION | Freq: Four times a day (QID) | ORAL | Status: AC | PRN

## 2011-10-17 MED ORDER — METHYLPREDNISOLONE ACETATE 80 MG/ML IJ SUSP
80.0000 mg | Freq: Once | INTRAMUSCULAR | Status: AC
  Administered 2011-10-17: 80 mg via INTRAMUSCULAR

## 2011-10-17 NOTE — Progress Notes (Signed)
  Subjective:    Patient ID: Darin Hunter, male    DOB: September 03, 1956, 55 y.o.   MRN: [id number]  HPI Patient seen with about 4 day history of cough. Associated nasal congestion. Productive of clear sputum. No fever. Ongoing nicotine use. No reported history of COPD. Cough has been severe at times. Has not taken any over-the-counter medications. Feels he may be wheezing off and on. Denies any sore throat, nausea, vomiting, or diarrhea.   Review of Systems  HENT: Positive for congestion. Negative for sore throat, trouble swallowing and voice change.   Respiratory: Positive for cough and wheezing.   Cardiovascular: Negative for chest pain, palpitations and leg swelling.  Neurological: Negative for syncope and headaches.       Objective:   Physical Exam  Constitutional: He appears well-developed and well-nourished.  HENT:  Mouth/Throat: Oropharynx is clear and moist.  Neck: Neck supple.  Cardiovascular: Normal rate and regular rhythm.   Pulmonary/Chest: Effort normal.       Patient has a few faint expiratory wheezes. No rales. Symmetric breath sounds. No retractions.  Musculoskeletal: He exhibits no edema.  Lymphadenopathy:    He has no cervical adenopathy.          Assessment & Plan:  Asthmatic bronchitis. Suspect viral trigger. Depo-Medrol 80 mg IM. Hycodan for nighttime cough suppression. Followup promptly for any fever or worsening symptoms

## 2011-10-17 NOTE — Patient Instructions (Addendum)
Follow up promptly for any fever or worsening symptoms 

## 2011-12-09 ENCOUNTER — Encounter: Payer: Self-pay | Admitting: Internal Medicine

## 2011-12-09 ENCOUNTER — Ambulatory Visit (INDEPENDENT_AMBULATORY_CARE_PROVIDER_SITE_OTHER): Payer: PRIVATE HEALTH INSURANCE | Admitting: Internal Medicine

## 2011-12-09 DIAGNOSIS — I779 Disorder of arteries and arterioles, unspecified: Secondary | ICD-10-CM

## 2011-12-09 DIAGNOSIS — I10 Essential (primary) hypertension: Secondary | ICD-10-CM

## 2011-12-09 DIAGNOSIS — N200 Calculus of kidney: Secondary | ICD-10-CM

## 2011-12-09 DIAGNOSIS — E785 Hyperlipidemia, unspecified: Secondary | ICD-10-CM

## 2011-12-09 HISTORY — DX: Calculus of kidney: N20.0

## 2011-12-09 LAB — HEPATIC FUNCTION PANEL
ALT: 23 U/L (ref 0–53)
Albumin: 4.5 g/dL (ref 3.5–5.2)
Total Bilirubin: 0.9 mg/dL (ref 0.3–1.2)

## 2011-12-09 LAB — POCT URINALYSIS DIPSTICK
Glucose, UA: NEGATIVE
Leukocytes, UA: NEGATIVE
Nitrite, UA: NEGATIVE
Protein, UA: NEGATIVE
Spec Grav, UA: 1.025
Urobilinogen, UA: 0.2

## 2011-12-09 LAB — LIPID PANEL
HDL: 47.1 mg/dL (ref >39.00)
Total CHOL/HDL Ratio: 3
Triglycerides: 86 mg/dL (ref 0.0–149.0)
VLDL: 17.2 mg/dL (ref 0.0–40.0)

## 2011-12-09 LAB — BASIC METABOLIC PANEL
GFR: 82.92 mL/min (ref >60.00)
Glucose, Bld: 92 mg/dL (ref 70–99)
Potassium: 5.1 mEq/L (ref 3.5–5.1)
Sodium: 146 mEq/L — ABNORMAL HIGH (ref 135–145)

## 2011-12-09 NOTE — Assessment & Plan Note (Signed)
BP Readings from Last 3 Encounters:  12/09/11 124/80  10/17/11 130/72  06/11/11 126/76   Well controlled--continue meds

## 2011-12-09 NOTE — Assessment & Plan Note (Signed)
This is by pt's history only Check UA today

## 2011-12-09 NOTE — Progress Notes (Signed)
Patient ID: Darin Hunter, male   DOB: 19-Aug-1956, 55 y.o.   MRN: [id number] htn--no home BPS Lab Results  Component Value Date   CREATININE 0.9 02/22/2011     Patient states that he has a hx of kidney stones--thinks he had one last week  Lipids---tolerating meds without difficutly  Past Medical History  Diagnosis Date  . Hypertension     History   Social History  . Marital Status: Married    Spouse Name: N/A    Number of Children: N/A  . Years of Education: N/A   Occupational History  . Not on file.   Social History Main Topics  . Smoking status: Current Everyday Smoker -- 2.0 packs/day  . Smokeless tobacco: Not on file  . Alcohol Use: 8.4 oz/week    14 Glasses of wine per week  . Drug Use: No  . Sexually Active: Yes   Other Topics Concern  . Not on file   Social History Narrative  . No narrative on file    No past surgical history on file.  Family History  Problem Relation Age of Onset  . Cancer Mother   . Cancer Maternal Grandfather     Allergies  Allergen Reactions  . Lisinopril     REACTION: facial swelling?    Current Outpatient Prescriptions on File Prior to Visit  Medication Sig Dispense Refill  . aspirin 325 MG EC tablet Take 325 mg by mouth daily.        Marland Kitchen nystatin-triamcinolone (MYCOLOG II) cream Apply topically 2 (two) times daily.  30 g  2  . olmesartan-hydrochlorothiazide (BENICAR HCT) 40-25 MG per tablet Take 1 tablet by mouth daily.  15 tablet  5  . simvastatin (ZOCOR) 20 MG tablet Take 1 tablet (20 mg total) by mouth every evening.  30 tablet  11     patient denies chest pain, shortness of breath, orthopnea. Denies lower extremity edema, abdominal pain, change in appetite, change in bowel movements. Patient denies rashes, musculoskeletal complaints. No other specific complaints in a complete review of systems.   BP 124/80  Pulse 84  Temp(Src) 98.3 F (36.8 C) (Oral)  Ht 6\' 4"  (1.93 m)  Wt 219 lb (99.338 kg)  BMI 26.66 kg/m2

## 2012-01-21 ENCOUNTER — Other Ambulatory Visit: Payer: Self-pay | Admitting: Internal Medicine

## 2012-02-07 ENCOUNTER — Ambulatory Visit (INDEPENDENT_AMBULATORY_CARE_PROVIDER_SITE_OTHER): Payer: PRIVATE HEALTH INSURANCE | Admitting: Family Medicine

## 2012-02-07 ENCOUNTER — Encounter: Payer: Self-pay | Admitting: Family Medicine

## 2012-02-07 VITALS — BP 130/80 | Temp 98.3°F | Wt 228.0 lb

## 2012-02-07 DIAGNOSIS — J329 Chronic sinusitis, unspecified: Secondary | ICD-10-CM

## 2012-02-07 MED ORDER — AMOXICILLIN-POT CLAVULANATE 875-125 MG PO TABS: 1.0000 | ORAL_TABLET | Freq: Two times a day (BID) | ORAL | Status: AC

## 2012-02-07 NOTE — Progress Notes (Signed)
  Subjective:    Patient ID: Darin Hunter, male    DOB: 05-Aug-1956, 56 y.o.   MRN: [id number]  HPI  1 week history of progressive sinus symptoms. Patient has right facial pain maxillary sinus region. Also right upper teeth pain. Possible low-grade fever past couple days. Intermittent headache, sore throat, and dry cough. Tried over-the-counter medications without relief. History frequent sinusitis the past. History of nicotine use.   Review of Systems  Constitutional: Positive for fever and fatigue. Negative for chills.  HENT: Positive for congestion, sore throat and sinus pressure.   Respiratory: Positive for cough.   Neurological: Positive for headaches.       Objective:   Physical Exam  Constitutional: He appears well-developed and well-nourished.  HENT:  Right Ear: External ear normal.  Left Ear: External ear normal.  Mouth/Throat: Oropharynx is clear and moist.       Erythematous nasal mucosa with thick yellow mucous right naris  Neck: Neck supple.  Cardiovascular: Normal rate and regular rhythm.   Pulmonary/Chest: Effort normal and breath sounds normal. No respiratory distress. He has no wheezes. He has no rales.          Assessment & Plan:  Acute right maxillary sinusitis. Augmentin 875 mg twice daily for 10 days. Followup as needed

## 2012-02-07 NOTE — Patient Instructions (Signed)

## 2012-03-28 ENCOUNTER — Other Ambulatory Visit: Payer: Self-pay | Admitting: Internal Medicine

## 2012-04-22 ENCOUNTER — Other Ambulatory Visit: Payer: Self-pay | Admitting: Internal Medicine

## 2012-04-22 MED ORDER — OLMESARTAN MEDOXOMIL-HCTZ 40-25 MG PO TABS: 1.0000 | ORAL_TABLET | Freq: Every day | ORAL | Status: DC

## 2012-04-22 MED ORDER — SIMVASTATIN 20 MG PO TABS: 20.0000 mg | ORAL_TABLET | Freq: Every day | ORAL | Status: DC

## 2012-04-22 NOTE — Telephone Encounter (Signed)
Pt needs new rxs benicar 40-25mg  and simvastatin 20mg  call into [facility] [phone]. Pt is switching pharm

## 2012-04-22 NOTE — Telephone Encounter (Signed)
rx sent in to pharmacy electronically

## 2012-05-13 ENCOUNTER — Encounter: Payer: Self-pay | Admitting: Family Medicine

## 2012-05-13 ENCOUNTER — Ambulatory Visit (INDEPENDENT_AMBULATORY_CARE_PROVIDER_SITE_OTHER): Payer: PRIVATE HEALTH INSURANCE | Admitting: Family Medicine

## 2012-05-13 VITALS — BP 120/68 | Temp 97.8°F | Wt 220.0 lb

## 2012-05-13 DIAGNOSIS — J3489 Other specified disorders of nose and nasal sinuses: Secondary | ICD-10-CM

## 2012-05-13 DIAGNOSIS — R0981 Nasal congestion: Secondary | ICD-10-CM

## 2012-05-13 MED ORDER — METHYLPREDNISOLONE ACETATE 80 MG/ML IJ SUSP
80.0000 mg | Freq: Once | INTRAMUSCULAR | Status: AC
  Administered 2012-05-13: 80 mg via INTRAMUSCULAR

## 2012-05-13 NOTE — Patient Instructions (Signed)
Consider over-the-counter antihistamine such as Claritin, Zyrtec, or Allegra. Touch base the next week if symptoms not improving. Qnasl 2 sprays per nostril once daily.

## 2012-05-13 NOTE — Progress Notes (Signed)
  Subjective:    Patient ID: Darin Hunter, male    DOB: [birth year], 56 y.o.   MRN: [id number]  HPI  Worsening sinus congestion. Patient's had frequent sneezing and frequent eye symptoms and progressive nasal congestion/ postnasal drip. He has not tried any antihistamines. No fever. Occasional dry cough. Not aware of any wheezing. Current smoker. Denies purulent secretions. History of frequent sinusitis in the past.   Review of Systems As per history of present illness    Objective:   Physical Exam  Constitutional: He appears well-developed and well-nourished.  HENT:  Right Ear: External ear normal.  Left Ear: External ear normal.  Mouth/Throat: Oropharynx is clear and moist.       Nasal mucosa somewhat erythematous with clear mucus  Neck: Neck supple.  Cardiovascular: Normal rate and regular rhythm.   Pulmonary/Chest: Effort normal and breath sounds normal. No respiratory distress. He has no wheezes. He has no rales.  Lymphadenopathy:    He has no cervical adenopathy.          Assessment & Plan:  Rhinitis. Suspect allergic. He had severe progressive symptoms over several days if not weeks. Over-the-counter antihistamine. Samples of Qnasl to use daily-2 sprays per nostril daily. Depo-Medrol 80 mg IM

## 2012-06-11 ENCOUNTER — Encounter: Payer: Self-pay | Admitting: Family Medicine

## 2012-06-11 ENCOUNTER — Ambulatory Visit (INDEPENDENT_AMBULATORY_CARE_PROVIDER_SITE_OTHER): Payer: PRIVATE HEALTH INSURANCE | Admitting: Family Medicine

## 2012-06-11 VITALS — BP 140/82 | Temp 98.7°F | Wt 211.0 lb

## 2012-06-11 DIAGNOSIS — IMO0002 Reserved for concepts with insufficient information to code with codable children: Secondary | ICD-10-CM

## 2012-06-11 DIAGNOSIS — T148 Other injury of unspecified body region: Secondary | ICD-10-CM

## 2012-06-11 DIAGNOSIS — B349 Viral infection, unspecified: Secondary | ICD-10-CM

## 2012-06-11 DIAGNOSIS — W57XXXA Bitten or stung by nonvenomous insect and other nonvenomous arthropods, initial encounter: Secondary | ICD-10-CM

## 2012-06-11 DIAGNOSIS — S20319A Abrasion of unspecified front wall of thorax, initial encounter: Secondary | ICD-10-CM

## 2012-06-11 DIAGNOSIS — B9789 Other viral agents as the cause of diseases classified elsewhere: Secondary | ICD-10-CM

## 2012-06-11 DIAGNOSIS — T148XXA Other injury of unspecified body region, initial encounter: Secondary | ICD-10-CM

## 2012-06-11 MED ORDER — DOXYCYCLINE HYCLATE 100 MG PO TABS: 100.0000 mg | ORAL_TABLET | Freq: Two times a day (BID) | ORAL | Status: AC

## 2012-06-11 NOTE — Progress Notes (Signed)
  Subjective:    Patient ID: Darin Hunter, male    DOB: [birth year], 56 y.o.   MRN: [id number]  HPI  Acute visit. Patient seen for the following issues  Last Tuesday he fell in the bathtub. No syncope. Abrasion left chest wall. No signs of secondary infection. Minimal pain with breathing. No dyspnea.  Recently has pulled off several small ticks.   He had some intermittent headaches, myalgias, chills but no fever. He had some cough productive of green sputum and some increased nasal congestion. Has not noted any rash other than some nonblanching rash both wrists. No nausea or vomiting. No abdominal pain. No dysuria. Has had poor appetite.   Review of Systems  Constitutional: Positive for chills and fatigue. Negative for fever.  HENT: Positive for congestion. Negative for sore throat and neck stiffness.   Respiratory: Positive for cough. Negative for wheezing.   Cardiovascular: Negative for chest pain.  Musculoskeletal: Positive for myalgias.  Neurological: Positive for headaches.  Hematological: Negative for adenopathy.  Psychiatric/Behavioral: Negative for confusion.       Objective:   Physical Exam  Constitutional: He is oriented to person, place, and time. He appears well-developed and well-nourished.  HENT:  Right Ear: External ear normal.  Left Ear: External ear normal.  Mouth/Throat: Oropharynx is clear and moist.  Neck: Neck supple. No thyromegaly present.  Cardiovascular: Normal rate and regular rhythm.   Pulmonary/Chest: Effort normal and breath sounds normal. No respiratory distress. He has no wheezes. He has no rales.  Musculoskeletal: He exhibits no edema.  Lymphadenopathy:    He has no cervical adenopathy.  Neurological: He is alert and oriented to person, place, and time.  Skin:       Patient has a couple areas of ecchymosis both wrists dorsally. These do not appear typical of petechiae.  No erythema migrans type rash.  Small abrasion left side. No signs of  secondary infection          Assessment & Plan:  Patient presents with constitutional symptoms of chills, myalgias, fatigue, nasal congestion, and cough. Doubt related to tick fever. He has had several recent deer tick bites recently. Increased productive cough. Start doxycycline 100 mg twice a day for 10 days.  Small abrasion left rib cage from fall. No signs of secondary infection.

## 2012-08-07 ENCOUNTER — Encounter: Payer: Self-pay | Admitting: Internal Medicine

## 2012-08-11 ENCOUNTER — Encounter: Payer: Self-pay | Admitting: Internal Medicine

## 2012-09-29 ENCOUNTER — Encounter: Payer: Self-pay | Admitting: Internal Medicine

## 2012-09-29 ENCOUNTER — Other Ambulatory Visit (INDEPENDENT_AMBULATORY_CARE_PROVIDER_SITE_OTHER): Payer: PRIVATE HEALTH INSURANCE

## 2012-09-29 ENCOUNTER — Ambulatory Visit (AMBULATORY_SURGERY_CENTER): Payer: PRIVATE HEALTH INSURANCE

## 2012-09-29 VITALS — Ht 76.0 in | Wt 213.5 lb

## 2012-09-29 DIAGNOSIS — Z86010 Personal history of colonic polyps: Secondary | ICD-10-CM

## 2012-09-29 DIAGNOSIS — Z800 Family history of malignant neoplasm of digestive organs: Secondary | ICD-10-CM

## 2012-09-29 DIAGNOSIS — Z1211 Encounter for screening for malignant neoplasm of colon: Secondary | ICD-10-CM

## 2012-09-29 DIAGNOSIS — Z0000 Encounter for general adult medical examination without abnormal findings: Secondary | ICD-10-CM

## 2012-09-29 LAB — BASIC METABOLIC PANEL
Calcium: 9.6 mg/dL (ref 8.4–10.5)
GFR: 101.09 mL/min (ref >60.00)
Glucose, Bld: 94 mg/dL (ref 70–99)
Potassium: 5.4 mEq/L — ABNORMAL HIGH (ref 3.5–5.1)
Sodium: 140 mEq/L (ref 135–145)

## 2012-09-29 LAB — CBC WITH DIFFERENTIAL/PLATELET
Basophils Absolute: 0 10*3/uL (ref 0.0–0.1)
Eosinophils Relative: 5.7 % — ABNORMAL HIGH (ref 0.0–5.0)
HCT: 42.6 % (ref 39.0–52.0)
Hemoglobin: 14.3 g/dL (ref 13.0–17.0)
Lymphocytes Relative: 29.5 % (ref 12.0–46.0)
Lymphs Abs: 2.6 10*3/uL (ref 0.7–4.0)
Monocytes Relative: 8.8 % (ref 3.0–12.0)
Neutro Abs: 4.9 10*3/uL (ref 1.4–7.7)
RBC: 4.33 Mil/uL (ref 4.22–5.81)
WBC: 8.9 10*3/uL (ref 4.5–10.5)

## 2012-09-29 LAB — POCT URINALYSIS DIPSTICK
Blood, UA: NEGATIVE
Glucose, UA: NEGATIVE
Ketones, UA: NEGATIVE
Protein, UA: NEGATIVE
Spec Grav, UA: 1.025
Urobilinogen, UA: 0.2

## 2012-09-29 LAB — HEPATIC FUNCTION PANEL
ALT: 20 U/L (ref 0–53)
AST: 20 U/L (ref 0–37)
Albumin: 4.2 g/dL (ref 3.5–5.2)
Alkaline Phosphatase: 55 U/L (ref 39–117)

## 2012-09-29 LAB — LIPID PANEL
Cholesterol: 154 mg/dL (ref 0–200)
LDL Cholesterol: 88 mg/dL (ref 0–99)
Total CHOL/HDL Ratio: 4
Triglycerides: 146 mg/dL (ref 0.0–149.0)
VLDL: 29.2 mg/dL (ref 0.0–40.0)

## 2012-09-29 LAB — TSH: TSH: 2.33 u[IU]/mL (ref 0.35–5.50)

## 2012-09-29 MED ORDER — MOVIPREP 100 G PO SOLR: ORAL | Status: DC

## 2012-10-06 ENCOUNTER — Ambulatory Visit (INDEPENDENT_AMBULATORY_CARE_PROVIDER_SITE_OTHER): Payer: PRIVATE HEALTH INSURANCE | Admitting: Internal Medicine

## 2012-10-06 ENCOUNTER — Encounter: Payer: Self-pay | Admitting: Internal Medicine

## 2012-10-06 VITALS — BP 146/90 | HR 84 | Temp 98.0°F | Ht 76.0 in | Wt 216.0 lb

## 2012-10-06 DIAGNOSIS — Z23 Encounter for immunization: Secondary | ICD-10-CM

## 2012-10-06 DIAGNOSIS — I10 Essential (primary) hypertension: Secondary | ICD-10-CM

## 2012-10-06 LAB — BASIC METABOLIC PANEL
BUN: 17 mg/dL (ref 6–23)
Creatinine, Ser: 0.9 mg/dL (ref 0.4–1.5)
GFR: 93.35 mL/min (ref >60.00)

## 2012-10-06 MED ORDER — LOSARTAN POTASSIUM-HCTZ 50-12.5 MG PO TABS: 1.0000 | ORAL_TABLET | Freq: Every day | ORAL | Status: DC
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
## 2012-10-08 ENCOUNTER — Encounter: Payer: Self-pay | Admitting: Internal Medicine

## 2012-10-08 ENCOUNTER — Ambulatory Visit (AMBULATORY_SURGERY_CENTER): Payer: PRIVATE HEALTH INSURANCE | Admitting: Internal Medicine

## 2012-10-08 VITALS — BP 147/94 | HR 68 | Temp 98.1°F | Resp 21 | Ht 76.0 in | Wt 213.0 lb

## 2012-10-08 DIAGNOSIS — Z1211 Encounter for screening for malignant neoplasm of colon: Secondary | ICD-10-CM

## 2012-10-08 DIAGNOSIS — D126 Benign neoplasm of colon, unspecified: Secondary | ICD-10-CM

## 2012-10-08 DIAGNOSIS — Z860100 Personal history of colon polyps, unspecified: Secondary | ICD-10-CM

## 2012-10-08 DIAGNOSIS — Z800 Family history of malignant neoplasm of digestive organs: Secondary | ICD-10-CM

## 2012-10-08 MED ORDER — SODIUM CHLORIDE 0.9 % IV SOLN: 500.0000 mL | INTRAVENOUS | Status: DC
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
## 2012-10-08 NOTE — Patient Instructions (Addendum)
Findings:  Polyps Recommendations:  Repeat Colonoscopy in 5 years  YOU HAD AN ENDOSCOPIC PROCEDURE TODAY AT THE [facility]: Refer to the procedure report that was given to you for any specific questions about what was found during the examination.  If the procedure report does not answer your questions, please call your gastroenterologist to clarify.  If you requested that your care partner not be given the details of your procedure findings, then the procedure report has been included in a sealed envelope for you to review at your convenience later.  YOU SHOULD EXPECT: Some feelings of bloating in the abdomen. Passage of more gas than usual.  Walking can help get rid of the air that was put into your GI tract during the procedure and reduce the bloating. If you had a lower endoscopy (such as a colonoscopy or flexible sigmoidoscopy) you may notice spotting of blood in your stool or on the toilet paper. If you underwent a bowel prep for your procedure, then you may not have a normal bowel movement for a few days.  DIET: Your first meal following the procedure should be a light meal and then it is ok to progress to your normal diet.  A half-sandwich or bowl of soup is an example of a good first meal.  Heavy or fried foods are harder to digest and may make you feel nauseous or bloated.  Likewise meals heavy in dairy and vegetables can cause extra gas to form and this can also increase the bloating.  Drink plenty of fluids but you should avoid alcoholic beverages for 24 hours.  ACTIVITY: Your care partner should take you home directly after the procedure.  You should plan to take it easy, moving slowly for the rest of the day.  You can resume normal activity the day after the procedure however you should NOT DRIVE or use heavy machinery for 24 hours (because of the sedation medicines used during the test).    SYMPTOMS TO REPORT IMMEDIATELY: A gastroenterologist can be reached at any hour.   During normal business hours, 8:30 AM to 5:00 PM Monday through Friday, call [phone].  After hours and on weekends, please call the GI answering service at [phone] who will take a message and have the physician on call contact you.   Following lower endoscopy (colonoscopy or flexible sigmoidoscopy):  Excessive amounts of blood in the stool  Significant tenderness or worsening of abdominal pains  Swelling of the abdomen that is new, acute  Fever of 100F or higher  Following upper endoscopy (EGD)  Vomiting of blood or coffee ground material  New chest pain or pain under the shoulder blades  Painful or persistently difficult swallowing  New shortness of breath  Fever of 100F or higher  Black, tarry-looking stools  FOLLOW UP: If any biopsies were taken you will be contacted by phone or by letter within the next 1-3 weeks.  Call your gastroenterologist if you have not heard about the biopsies in 3 weeks.  Our staff will call the home number listed on your records the next business day following your procedure to check on you and address any questions or concerns that you may have at that time regarding the information given to you following your procedure. This is a courtesy call and so if there is no answer at the home number and we have not heard from you through the emergency physician on call, we will assume that you have returned to your  regular daily activities without incident.  SIGNATURES/CONFIDENTIALITY: You and/or your care partner have signed paperwork which will be entered into your electronic medical record.  These signatures attest to the fact that that the information above on your After Visit Summary has been reviewed and is understood.  Full responsibility of the confidentiality of this discharge information lies with you and/or your care-partner.   Please follow all discharge instructions given to you by the recovery room nurse. If you have any questions or  problems after discharge please call one of the numbers listed above. You will receive a phone call in the am to see how you are doing and answer any questions you may have. Thank you for choosing [facility] for your health care needs.  

## 2012-10-08 NOTE — Progress Notes (Addendum)
Vital signs recorded in error, [id number].

## 2012-10-08 NOTE — Progress Notes (Addendum)
1005 - Dr Marina Goodell aware of BP.  Pt states he did not take BP med this am.  Instructed him to take meds when he gets home.

## 2012-10-08 NOTE — Op Note (Signed)
[facility] [address] [place], [address]   COLONOSCOPY PROCEDURE REPORT  PATIENT: Darin Hunter, Darin Hunter  MR#: [id number] BIRTHDATE: 11/13/1956 , 56  yrs. old GENDER: Male ENDOSCOPIST: Roxy Cedar, MD REFERRED [id number] Program Recall PROCEDURE DATE:  10/08/2012 PROCEDURE:   Colonoscopy with snare polypectomy    x 2 ASA CLASS:   Class II INDICATIONS:patient's personal history of adenomatous colon polyps and patient's immediate family history of colon cancer. MEDICATIONS: MAC sedation, administered by CRNA and propofol (Diprivan) 480mg  IV  DESCRIPTION OF PROCEDURE:   After the risks benefits and alternatives of the procedure were thoroughly explained, informed consent was obtained.  A digital rectal exam revealed no abnormalities of the rectum.   The LB [id number]  endoscope was introduced through the anus and advanced to the cecum, which was identified by both the appendix and ileocecal valve. No adverse events experienced.   The quality of the prep was excellent, using MoviPrep  The instrument was then slowly withdrawn as the colon was fully examined.      COLON FINDINGS: Two diminutive polyps were found at the cecum and in the ascending colon.  A polypectomy was performed with a cold snare.  The resection was complete and the polyp tissue was completely retrieved. The colon was otherwise normal except for minimal sigmoid diverticular changes.  Retroflexed views revealed internal hemorrhoids. The time to cecum=2 minutes 01 seconds. Withdrawal time=23 minutes 23 seconds.  The scope was withdrawn and the procedure completed. COMPLICATIONS: There were no complications.  ENDOSCOPIC IMPRESSION: 1. Two diminutive polyps were found at the cecum and in the ascending colon; polypectomy was performed with a cold snare  RECOMMENDATIONS: 1. Follow up colonoscopy in 5 years   eSigned:  Roxy Cedar, MD 10/08/2012 9:51 AM   cc: Lindley Magnus, MD and The Patient

## 2012-10-09 ENCOUNTER — Telehealth: Payer: Self-pay | Admitting: *Deleted

## 2012-10-09 NOTE — Telephone Encounter (Signed)
  Follow up Call-  Call back number 10/08/2012  Post procedure Call Back phone  # [phone]  Permission to leave phone message Yes     Patient questions:  Do you have a fever, pain , or abdominal swelling? no Pain Score  0 *  Have you tolerated food without any problems? yes  Have you been able to return to your normal activities? yes  Do you have any questions about your discharge instructions: Diet   no Medications  no Follow up visit  no  Do you have questions or concerns about your Care? no  Actions: * If pain score is 4 or above: No action needed, pain <4.

## 2012-10-14 ENCOUNTER — Encounter: Payer: Self-pay | Admitting: Internal Medicine

## 2012-10-15 ENCOUNTER — Encounter: Payer: PRIVATE HEALTH INSURANCE | Admitting: Internal Medicine

## 2012-11-06 ENCOUNTER — Encounter: Payer: Self-pay | Admitting: Family Medicine

## 2012-11-06 ENCOUNTER — Ambulatory Visit (INDEPENDENT_AMBULATORY_CARE_PROVIDER_SITE_OTHER): Payer: PRIVATE HEALTH INSURANCE | Admitting: Family Medicine

## 2012-11-06 ENCOUNTER — Ambulatory Visit: Payer: PRIVATE HEALTH INSURANCE | Admitting: Internal Medicine

## 2012-11-06 VITALS — BP 122/80 | HR 88 | Temp 98.5°F | Wt 217.0 lb

## 2012-11-06 DIAGNOSIS — J069 Acute upper respiratory infection, unspecified: Secondary | ICD-10-CM

## 2012-11-06 DIAGNOSIS — J329 Chronic sinusitis, unspecified: Secondary | ICD-10-CM

## 2012-11-06 DIAGNOSIS — I10 Essential (primary) hypertension: Secondary | ICD-10-CM

## 2012-11-06 MED ORDER — FLUTICASONE PROPIONATE 50 MCG/ACT NA SUSP: 2.0000 | Freq: Every day | NASAL | Status: DC

## 2012-11-06 MED ORDER — AMOXICILLIN 875 MG PO TABS: 875.0000 mg | ORAL_TABLET | Freq: Two times a day (BID) | ORAL | Status: DC

## 2012-11-06 NOTE — Patient Instructions (Addendum)
INSTRUCTIONS FOR UPPER RESPIRATORY INFECTION:  -plenty of rest and fluids  -nasal saline wash 2-3 times daily (use prepackaged nasal saline or bottled/distilled water if making your own)   -clean nose with nasal saline before using the nasal steroid or sinex  -can use sinex nasal spray for drainage and nasal congestion - but do NOT use longer then 3-4 days  -can use tylenol or ibuprofen as directed for aches and sorethroat  -in the winter time, using a humidifier at night is helpful (please follow cleaning instructions)  -if you are taking a cough medication - use only as directed, may also try a teaspoon of honey to coat the throat and throat lozenges  -for sore throat, salt water gargles can help  -follow up if you have fevers, are worsening or not getting better in 3-4 days can start antibiotic and let your doctor know if not improving on this
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
## 2012-11-09 ENCOUNTER — Ambulatory Visit: Payer: PRIVATE HEALTH INSURANCE | Admitting: Internal Medicine
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
## 2013-02-22 ENCOUNTER — Encounter: Payer: Self-pay | Admitting: Family Medicine

## 2013-02-22 ENCOUNTER — Telehealth: Payer: Self-pay | Admitting: Family Medicine

## 2013-02-22 ENCOUNTER — Ambulatory Visit (INDEPENDENT_AMBULATORY_CARE_PROVIDER_SITE_OTHER): Payer: PRIVATE HEALTH INSURANCE | Admitting: Family Medicine

## 2013-02-22 VITALS — BP 148/86 | HR 81 | Temp 97.5°F | Wt 222.0 lb

## 2013-02-22 DIAGNOSIS — J069 Acute upper respiratory infection, unspecified: Secondary | ICD-10-CM

## 2013-02-22 DIAGNOSIS — J45909 Unspecified asthma, uncomplicated: Secondary | ICD-10-CM

## 2013-02-22 MED ORDER — ALBUTEROL SULFATE HFA 108 (90 BASE) MCG/ACT IN AERS: 2.0000 | INHALATION_SPRAY | Freq: Four times a day (QID) | RESPIRATORY_TRACT | Status: DC | PRN

## 2013-02-22 MED ORDER — HYDROCODONE-HOMATROPINE 5-1.5 MG/5ML PO SYRP: 5.0000 mL | ORAL_SOLUTION | Freq: Every evening | ORAL | Status: DC | PRN

## 2013-02-22 NOTE — Patient Instructions (Addendum)
INSTRUCTIONS FOR UPPER RESPIRATORY INFECTION:  -plenty of rest and fluids  -quit smoking  -nasal saline wash 2-3 times daily (use prepackaged nasal saline or bottled/distilled water if making your own)   -can use sinex nasal spray for drainage and nasal congestion - but do NOT use longer then 3-4 days  -can use tylenol or ibuprofen as directed for aches and sorethroat  -in the winter time, using a humidifier at night is helpful (please follow cleaning instructions)  -if you are taking a cough medication - use only as directed, may also try a teaspoon of honey to coat the throat and throat lozenges  -for sore throat, salt water gargles can help  -follow up if you have fevers, facial pain, tooth pain, difficulty breathing or are worsening or not getting better in 5-7 days

## 2013-02-22 NOTE — Telephone Encounter (Signed)
Pls advise.  
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
## 2013-02-22 NOTE — Telephone Encounter (Signed)
Pt has rx for hycodan the medication is on back order, pharm would like to switch to something else?tussinex cough med is available

## 2013-02-23 MED ORDER — HYDROCOD POLST-CHLORPHEN POLST 10-8 MG/5ML PO LQCR: 5.0000 mL | Freq: Two times a day (BID) | ORAL | Status: DC | PRN

## 2013-02-23 NOTE — Telephone Encounter (Signed)
rx called into pharmacy

## 2013-02-23 NOTE — Telephone Encounter (Signed)
D/c hycodan.  tussionex 1 tsp po bid prn cough, 120 cc

## 2013-03-01 ENCOUNTER — Encounter: Payer: Self-pay | Admitting: Family Medicine

## 2013-03-01 ENCOUNTER — Ambulatory Visit (INDEPENDENT_AMBULATORY_CARE_PROVIDER_SITE_OTHER): Payer: PRIVATE HEALTH INSURANCE | Admitting: Family Medicine

## 2013-03-01 VITALS — BP 120/88 | HR 80 | Temp 98.0°F | Wt 224.0 lb

## 2013-03-01 DIAGNOSIS — J329 Chronic sinusitis, unspecified: Secondary | ICD-10-CM

## 2013-03-01 MED ORDER — AMOXICILLIN 875 MG PO TABS: 875.0000 mg | ORAL_TABLET | Freq: Two times a day (BID) | ORAL | Status: DC
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
## 2013-03-17 ENCOUNTER — Ambulatory Visit: Payer: PRIVATE HEALTH INSURANCE | Admitting: Internal Medicine

## 2013-03-18 ENCOUNTER — Ambulatory Visit (INDEPENDENT_AMBULATORY_CARE_PROVIDER_SITE_OTHER): Payer: PRIVATE HEALTH INSURANCE | Admitting: Family Medicine

## 2013-03-18 ENCOUNTER — Encounter: Payer: Self-pay | Admitting: Family Medicine

## 2013-03-18 VITALS — BP 110/68 | Temp 99.0°F | Wt 226.0 lb

## 2013-03-18 DIAGNOSIS — J329 Chronic sinusitis, unspecified: Secondary | ICD-10-CM

## 2013-03-18 MED ORDER — LEVOFLOXACIN 500 MG PO TABS: 500.0000 mg | ORAL_TABLET | Freq: Every day | ORAL | Status: DC

## 2013-03-18 NOTE — Patient Instructions (Addendum)
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
## 2013-04-20 ENCOUNTER — Ambulatory Visit (INDEPENDENT_AMBULATORY_CARE_PROVIDER_SITE_OTHER): Payer: PRIVATE HEALTH INSURANCE | Admitting: Internal Medicine

## 2013-04-20 ENCOUNTER — Encounter: Payer: Self-pay | Admitting: Internal Medicine

## 2013-04-20 VITALS — BP 162/98 | Temp 98.1°F | Wt 219.0 lb

## 2013-04-20 DIAGNOSIS — J9801 Acute bronchospasm: Secondary | ICD-10-CM

## 2013-04-20 MED ORDER — PREDNISONE 10 MG PO TABS: ORAL_TABLET | ORAL | Status: DC

## 2013-04-20 MED ORDER — ALBUTEROL SULFATE HFA 108 (90 BASE) MCG/ACT IN AERS: 2.0000 | INHALATION_SPRAY | Freq: Four times a day (QID) | RESPIRATORY_TRACT | Status: DC | PRN

## 2013-04-20 NOTE — Progress Notes (Signed)
  Subjective:    Patient ID: Darin Hunter, male    DOB: 12-29-1956, 57 y.o.   MRN: [id number]  HPI  3-4 day hx of URI sxs Has cough with wheeze (smoker) No fever No SOB He states he is feeling some better than yesterday  Reviewed pmh, psh, soc hx   Review of Systems  patient denies chest pain, shortness of breath, orthopnea. Denies lower extremity edema, abdominal pain, change in appetite, change in bowel movements. Patient denies rashes, musculoskeletal complaints. No other specific complaints in a complete review of systems.      Objective:   Physical Exam   well-developed well-nourished male in no acute distress. HEENT exam atraumatic, normocephalic, neck supple without jugular venous distention. Chest with bilateral wheezingcardiac exam S1-S2 are regular. Abdominal exam overweight with bowel sounds, soft and nontender. Extremities no edema. Neurologic exam is alert with a normal gait.     Assessment & Plan:  Bronchospasm-- doubt any infectious etiology Steroids and SABA

## 2013-05-28 ENCOUNTER — Ambulatory Visit (INDEPENDENT_AMBULATORY_CARE_PROVIDER_SITE_OTHER): Payer: PRIVATE HEALTH INSURANCE | Admitting: Family Medicine

## 2013-05-28 ENCOUNTER — Encounter: Payer: Self-pay | Admitting: Family Medicine

## 2013-05-28 VITALS — BP 140/84 | Temp 98.2°F | Wt 221.0 lb

## 2013-05-28 DIAGNOSIS — M549 Dorsalgia, unspecified: Secondary | ICD-10-CM

## 2013-05-28 DIAGNOSIS — B372 Candidiasis of skin and nail: Secondary | ICD-10-CM

## 2013-05-28 LAB — POCT URINALYSIS DIPSTICK
Glucose, UA: NEGATIVE
Protein, UA: NEGATIVE
Urobilinogen, UA: 0.2

## 2013-05-28 MED ORDER — NYSTATIN-TRIAMCINOLONE 100000-0.1 UNIT/GM-% EX CREA: TOPICAL_CREAM | Freq: Two times a day (BID) | CUTANEOUS | Status: DC
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
## 2013-05-28 NOTE — Patient Instructions (Signed)
-  heat for 15 inutes twice daily  -tylenol 500-1000mg  up to 3 times daily  -can use topical sports creams with capsacin or menthol  -home exercises daily  -follow up 4 weeks

## 2013-05-30 LAB — URINE CULTURE
Colony Count: NO GROWTH
Organism ID, Bacteria: NO GROWTH

## 2013-05-31 NOTE — Progress Notes (Signed)
Quick Note:  Left a message for pt to return call. ______ 

## 2013-06-01 NOTE — Progress Notes (Signed)
Quick Note:  Called and spoke with pt and pt is aware. ______ 

## 2013-09-23 ENCOUNTER — Encounter: Payer: Self-pay | Admitting: Internal Medicine

## 2013-09-23 ENCOUNTER — Ambulatory Visit (INDEPENDENT_AMBULATORY_CARE_PROVIDER_SITE_OTHER): Payer: PRIVATE HEALTH INSURANCE | Admitting: Internal Medicine

## 2013-09-23 VITALS — BP 130/82 | Temp 98.3°F | Wt 222.0 lb

## 2013-09-23 DIAGNOSIS — I10 Essential (primary) hypertension: Secondary | ICD-10-CM

## 2013-09-23 DIAGNOSIS — F17200 Nicotine dependence, unspecified, uncomplicated: Secondary | ICD-10-CM

## 2013-09-23 DIAGNOSIS — J069 Acute upper respiratory infection, unspecified: Secondary | ICD-10-CM

## 2013-09-23 NOTE — Assessment & Plan Note (Signed)
Smoking cessation encouraged.  Patient to consider Chantix.
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
## 2013-09-23 NOTE — Assessment & Plan Note (Signed)
Stable.  Arrange follow up blood work.  Patient's 10 year ASCVD 12%.  He should discuss starting statin medication with his PCP.

## 2013-09-23 NOTE — Patient Instructions (Addendum)
Use over the counter mucinex and saline nose spray as directed Use acetaminophen 650 mg every 8 hours as needed Drink plenty of fluids and rest Please contact our office if your symptoms do not improve or gets worse. Please discuss smoking cessation medication such as Chantix at your next office visit with your primary care physician. Please complete the following lab tests before your next follow up appointment: BMET, FLP, LFTs, TSH, CBCD - 401.9

## 2013-09-23 NOTE — Assessment & Plan Note (Signed)
57 year old white male with history of hypertension and tobacco use with probable viral upper respiratory infection. I recommended symptomatic treatment. We discussed signs and symptoms of bacterial sinusitis or bronchitis. Should his symptoms worsen he will contact our office. We discussed using amoxicillin 875 mg twice daily for 10 days. Patient advised to use intranasal saline and Mucinex.

## 2013-10-14 ENCOUNTER — Other Ambulatory Visit (INDEPENDENT_AMBULATORY_CARE_PROVIDER_SITE_OTHER): Payer: PRIVATE HEALTH INSURANCE

## 2013-10-14 ENCOUNTER — Ambulatory Visit (INDEPENDENT_AMBULATORY_CARE_PROVIDER_SITE_OTHER): Payer: PRIVATE HEALTH INSURANCE

## 2013-10-14 ENCOUNTER — Other Ambulatory Visit: Payer: Self-pay | Admitting: *Deleted

## 2013-10-14 DIAGNOSIS — I10 Essential (primary) hypertension: Secondary | ICD-10-CM

## 2013-10-14 DIAGNOSIS — Z79899 Other long term (current) drug therapy: Secondary | ICD-10-CM

## 2013-10-14 DIAGNOSIS — Z23 Encounter for immunization: Secondary | ICD-10-CM

## 2013-10-14 LAB — BASIC METABOLIC PANEL
BUN: 12 mg/dL (ref 6–23)
Chloride: 102 mEq/L (ref 96–112)
Creatinine, Ser: 1 mg/dL (ref 0.4–1.5)
Glucose, Bld: 221 mg/dL — ABNORMAL HIGH (ref 70–99)

## 2013-10-14 LAB — LIPID PANEL
Cholesterol: 167 mg/dL (ref 0–200)
VLDL: 55 mg/dL — ABNORMAL HIGH (ref 0.0–40.0)

## 2013-10-14 LAB — TSH: TSH: 0.79 u[IU]/mL (ref 0.35–5.50)

## 2013-10-14 LAB — HEPATIC FUNCTION PANEL
Bilirubin, Direct: 0 mg/dL (ref 0.0–0.3)
Total Bilirubin: 0.5 mg/dL (ref 0.3–1.2)

## 2013-10-14 LAB — CBC WITH DIFFERENTIAL/PLATELET
Basophils Absolute: 0 10*3/uL (ref 0.0–0.1)
Eosinophils Absolute: 0.7 10*3/uL (ref 0.0–0.7)
Eosinophils Relative: 6.2 % — ABNORMAL HIGH (ref 0.0–5.0)
MCHC: 34.6 g/dL (ref 30.0–36.0)
MCV: 95.6 fl (ref 78.0–100.0)
Monocytes Absolute: 0.7 10*3/uL (ref 0.1–1.0)
Neutrophils Relative %: 61.6 % (ref 43.0–77.0)
Platelets: 206 10*3/uL (ref 150.0–400.0)
RDW: 13.6 % (ref 11.5–14.6)
WBC: 11.7 10*3/uL — ABNORMAL HIGH (ref 4.5–10.5)

## 2013-10-14 LAB — LDL CHOLESTEROL, DIRECT: Direct LDL: 101 mg/dL

## 2013-10-14 MED ORDER — LOSARTAN POTASSIUM-HCTZ 50-12.5 MG PO TABS: 1.0000 | ORAL_TABLET | Freq: Every day | ORAL | Status: DC

## 2013-10-15 ENCOUNTER — Ambulatory Visit: Payer: PRIVATE HEALTH INSURANCE

## 2013-10-15 DIAGNOSIS — R7309 Other abnormal glucose: Secondary | ICD-10-CM

## 2013-10-15 LAB — HEMOGLOBIN A1C: Hgb A1c MFr Bld: 5.5 % (ref 4.6–6.5)

## 2013-10-18 ENCOUNTER — Telehealth: Payer: Self-pay | Admitting: Internal Medicine

## 2013-10-18 NOTE — Telephone Encounter (Signed)
Returning call to [facility] requesting lab results.

## 2013-10-19 NOTE — Telephone Encounter (Signed)
See result note.  

## 2013-10-21 ENCOUNTER — Ambulatory Visit: Payer: PRIVATE HEALTH INSURANCE | Admitting: Internal Medicine

## 2013-11-22 ENCOUNTER — Ambulatory Visit (INDEPENDENT_AMBULATORY_CARE_PROVIDER_SITE_OTHER): Payer: PRIVATE HEALTH INSURANCE | Admitting: Internal Medicine

## 2013-11-22 ENCOUNTER — Encounter: Payer: Self-pay | Admitting: Internal Medicine

## 2013-11-22 VITALS — BP 170/100 | HR 87 | Temp 98.2°F | Resp 20 | Wt 222.0 lb

## 2013-11-22 DIAGNOSIS — S43421A Sprain of right rotator cuff capsule, initial encounter: Secondary | ICD-10-CM

## 2013-11-22 DIAGNOSIS — I10 Essential (primary) hypertension: Secondary | ICD-10-CM

## 2013-11-22 DIAGNOSIS — S43429A Sprain of unspecified rotator cuff capsule, initial encounter: Secondary | ICD-10-CM

## 2013-11-22 NOTE — Patient Instructions (Signed)
Aleve 2 tablets twice daily  Surgery for Rotator Cuff Tear with Rehab Rotator cuff surgery is only recommended for individuals who have experienced persistent disability for greater than 3 months of non-surgical (conservative) treatment. Surgery is not necessary but is recommended for individuals who experience difficulty completing daily activities or athletes who are unable to compete. Rotator cuff tears do not usually heal without surgical intervention. If left alone small rotator cuff tears usually become larger. Younger athletes who have a rotator cuff tear may be recommended for surgery without attempting conservative rehabilitation. The purpose of surgery is to regain function of the shoulder joint and eliminate pain associated with the injury. In addition to repairing the tendon tear, the surgery often removes a portion of the bony roof of the shoulder (acromion) as well as the chronically thickened and inflamed membrane below the acromion (subacromial bursa). REASONS NOT TO OPERATE   Infection of the shoulder.  Inability to complete a rehabilitation program.  Patients who have other conditions (emotional or psychological) conditions that contribute to their shoulder condition. RISKS AND COMPLICATIONS  Infection.  Re-tear of the rotator cuff tendons or muscles.  Shoulder stiffness and/or weakness.  Inability to compete in athletics.  Acromioclavicular (AC) joint paint.  Risks of surgery: infection, bleeding, nerve damage, or damage to surrounding tissues. TECHNIQUE There are different surgical procedures used to treat rotator cuff tears. The type of procedure depends on the extent of injury as well as the surgeon's preference. All of the surgical techniques for rotator cuff tears have the same goal of repairing the torn tendon, removing part of the acromion, and removing the subacromial bursa. There are two main types of procedures: arthroscopic and open incision. Arthroscopic  procedures are usually completed and you go home the same day as surgery (outpatient). These procedures use multiple small incisions in which tools and a video camera are placed to work on the shoulder. An electric shaver removes the bursa, then a power burr shaves down the portion of the acromion that places pressure on the rotator cuff. Finally the rotator cuff is sewed (sutured) back to the humeral head. Open incision procedures require a larger incision. The deltoid muscle is detached from the acromion and a ligament in the shoulder (coracoacromial) is cut in order for the surgeon to access the rotator cuff. The subacromial bursa is removed as well as part of the acromion to give the rotator cuff room to move freely. The torn tendon is then sutured to the humeral head. After the rotator cuff is repaired, then the deltoid is reattached and the incision is closed up.  RECOVERY   Post-operative care depends on the surgical technique and the preferences of your therapist.  Keep the wound clean and dry for the first 10 to 14 days after surgery.  Keep your shoulder and arm in the sling provided to you for as long as you have been instructed to.  You will be given pain medications by your caregiver.  Passive (without using muscles) shoulder movements may be begun immediately after surgery.  It is important to follow through with you rehabilitation program in order to have the best possible recovery. RETURN TO SPORTS   The rehabilitation period will depend on the sport and position you play as well as the success of the operation.  The minimum recovery period is 6 months.  You must have regained complete shoulder motion and strength before returning to sports. SEEK IMMEDIATE MEDICAL CARE IF:   Any medications produce adverse side  effects.  Any complications from surgery occur:  Pain, numbness, or coldness in the extremity operated upon.  Discoloration of the nail beds (they become blue or  gray) of the extremity operated upon.  Signs of infections (fever, pain, inflammation, redness, or persistent bleeding). EXERCISES  RANGE OF MOTION (ROM) AND STRETCHING EXERCISES - Rotator Cuff Tear, Surgery For These exercises may help you restore your elbow mobility once your physician has discontinued your immobilization period. Beginning these before your provider's approval may result in delayed healing. Your symptoms may resolve with or without further involvement from your physician, physical therapist or athletic trainer. While completing these exercises, remember:   Restoring tissue flexibility helps normal motion to return to the joints. This allows healthier, less painful movement and activity.  An effective stretch should be held for at least 30 seconds. A stretch should never be painful. You should only feel a gentle lengthening or release in the stretch. ROM - Pendulum   Bend at the waist so that your right / left arm falls away from your body. Support yourself with your opposite hand on a solid surface, such as a table or a countertop.  Your right / left arm should be perpendicular to the ground. If it is not perpendicular, you need to lean over farther. Relax the muscles in your right / left arm and shoulder as much as possible.  Gently sway your hips and trunk so they move your right / left arm without any use of your right / left shoulder muscles.  Progress your movements so that your right / left arm moves side to side, then forward and backward, and finally, both clockwise and counterclockwise.  Complete __________ repetitions in each direction. Many people use this exercise to relieve discomfort in their shoulder as well as to gain range of motion. Repeat __________ times. Complete this exercise __________ times per day. STRETCH  Flexion, Seated   Sit in a firm chair so that your right / left forearm can rest on a table or on a table or countertop. Your right / left elbow  should rest below the height of your shoulder so that your shoulder feels supported and not tense or uncomfortable.  Keeping your right / left shoulder relaxed, lean forward at your waist, allowing your right / left hand to slide forward. Bend forward until you feel a moderate stretch in your shoulder, but before you feel an increase in your pain.  Hold __________ seconds. Slowly return to your starting position. Repeat __________ times. Complete this exercise __________ times per day.  STRETCH  Flexion, Standing   Stand with good posture. With an underhand grip on your right / left and an overhand grip on the opposite hand, grasp a broomstick or cane so that your hands are a little more than shoulder-width apart.  Keeping your right / left elbow straight and shoulder muscles relaxed, push the stick with your opposite hand to raise your right / left arm in front of your body and then overhead. Raise your arm until you feel a stretch in your right / left shoulder, but before you have increased shoulder pain.  Try to avoid shrugging your right / left shoulder as your arm rises by keeping your shoulder blade tucked down and toward your mid-back spine. Hold __________ seconds.  Slowly return to the starting position. Repeat __________ times. Complete this exercise __________ times per day.  STRETCH  Abduction, Supine   Stand with good posture. With an underhand grip on  your right / left and an overhand grip on the opposite hand, grasp a broomstick or cane so that your hands are a little more than shoulder-width apart.  Keeping your right / left elbow straight and shoulder muscles relaxed, push the stick with your opposite hand to raise your right / left arm out to the side of your body and then overhead. Raise your arm until you feel a stretch in your right / left shoulder, but before you have increased shoulder pain.  Try to avoid shrugging your right / left shoulder as your arm rises by keeping  your shoulder blade tucked down and toward your mid-back spine. Hold __________ seconds.  Slowly return to the starting position. Repeat __________ times. Complete this exercise __________ times per day.  ROM  Flexion, Active-Assisted  Lie on your back. You may bend your knees for comfort.  Grasp a broomstick or cane so your hands are about shoulder-width apart. Your right / left hand should grip the end of the stick/cane so that your hand is positioned "thumbs-up," as if you were about to shake hands.  Using your healthy arm to lead, raise your right / left arm overhead until you feel a gentle stretch in your shoulder. Hold __________ seconds.  Use the stick/cane to assist in returning your right / left arm to its starting position. Repeat __________ times. Complete this exercise __________ times per day.  STRETCH  External Rotation   Tuck a folded towel or small ball under your right / left upper arm. Grasp a broomstick or cane with an underhand grasp a little more than shoulder width apart. Bend your elbows to 90 degrees.  Stand with good posture or sit in a chair without arms.  Use your strong arm to push the stick across your body. Do not allow the towel or ball to fall. This will rotate your right / left arm away from your abdomen. Using the stick turn/rotate your hand and forearm away from your body. Hold __________ seconds. Repeat __________ times. Complete this exercise __________ times per day.  STRENGTHENING EXERCISES - Rotator Cuff Tear, Surgery For These exercises may help you begin to restore your elbow strength in the initial stage of your rehabilitation. Your physician will determine when you begin these exercises depending on the severity of your injury and the integrity of your repaired tissues. Beginning these before your provider's approval may result in delayed healing. While completing these exercises, remember:   Muscles can gain both the endurance and the strength  needed for everyday activities through controlled exercises.  Complete these exercises as instructed by your physician, physical therapist or athletic trainer. Progress the resistance and repetitions only as guided.  You may experience muscle soreness or fatigue, but the pain or discomfort you are trying to eliminate should never worsen during these exercises. If this pain does worsen, stop and make certain you are following the directions exactly. If the pain is still present after adjustments, discontinue the exercise until you can discuss the trouble with your clinician. STRENGTH - Shoulder Flexion, Isometric   With good posture and facing a wall, stand or sit about 4-6 inches away.  Keeping your right / left elbow straight, gently press the top of your fist into the wall. Increase the pressure gradually until you are pressing as hard as you can without shrugging your shoulder or increasing any shoulder discomfort.  Hold __________ seconds. Release the tension slowly. Relax your shoulder muscles completely before you do the next  repetition. Repeat __________ times. Complete this exercise __________ times per day.  STRENGTH - Shoulder Abductors, Isometric   With good posture, stand or sit about 4-6 inches from a wall with your right / left side facing the wall.  Bend your right / left elbow. Gently press your right / left elbow into the wall. Increase the pressure gradually until you are pressing as hard as you can without shrugging your shoulder or increasing any shoulder discomfort.  Hold __________ seconds.  Release the tension slowly. Relax your shoulder muscles completely before you do the next repetition. Repeat __________ times. Complete this exercise __________ times per day.  STRENGTH - Internal Rotators, Isometric   Keep your right / left elbow at your side and bend it 90 degrees.  Step into a door frame so that the inside of your right / left wrist can press against the door  frame without your upper arm leaving your side.  Gently press your right / left wrist into the door frame as if you were trying to draw the palm of your hand to your abdomen. Gradually increase the tension until you are pressing as hard as you can without shrugging your shoulder or increasing any shoulder discomfort.  Hold __________ seconds.  Release the tension slowly. Relax your shoulder muscles completely before you do the next repetition. Repeat __________ times. Complete this exercise __________ times per day.  STRENGTH - External Rotators, Isometric   Keep your right / left elbow at your side and bend it 90 degrees.  Step into a door frame so that the outside of your right / left wrist can press against the door frame without your upper arm leaving your side.  Gently press your right / left wrist into the door frame as if you were trying to swing the back of your hand away from your abdomen. Gradually increase the tension until you are pressing as hard as you can without shrugging your shoulder or increasing any shoulder discomfort.  Hold __________ seconds.  Release the tension slowly. Relax your shoulder muscles completely before you do the next repetition. Repeat __________ times. Complete this exercise __________ times per day.  Document Released: 12/16/2005 Document Revised: 03/09/2012 Document Reviewed: 03/30/2009 [facility] Patient Information 2014 [place], [place].

## 2013-11-22 NOTE — Progress Notes (Signed)
Pre-visit discussion using our clinic review tool. No additional management support is needed unless otherwise documented below in the visit note.  
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
## 2014-01-04 ENCOUNTER — Other Ambulatory Visit (INDEPENDENT_AMBULATORY_CARE_PROVIDER_SITE_OTHER): Payer: BC Managed Care – PPO

## 2014-01-04 DIAGNOSIS — Z0000 Encounter for general adult medical examination without abnormal findings: Secondary | ICD-10-CM

## 2014-01-04 LAB — CBC WITH DIFFERENTIAL/PLATELET
BASOS ABS: 0 10*3/uL (ref 0.0–0.1)
Basophils Relative: 0.4 % (ref 0.0–3.0)
EOS ABS: 0.7 10*3/uL (ref 0.0–0.7)
EOS PCT: 7.9 % — AB (ref 0.0–5.0)
HEMATOCRIT: 46.7 % (ref 39.0–52.0)
Hemoglobin: 16.2 g/dL (ref 13.0–17.0)
LYMPHS ABS: 2.7 10*3/uL (ref 0.7–4.0)
Lymphocytes Relative: 30.2 % (ref 12.0–46.0)
MCHC: 34.7 g/dL (ref 30.0–36.0)
MCV: 96.2 fl (ref 78.0–100.0)
MONO ABS: 0.8 10*3/uL (ref 0.1–1.0)
Monocytes Relative: 8.5 % (ref 3.0–12.0)
Neutro Abs: 4.8 10*3/uL (ref 1.4–7.7)
Neutrophils Relative %: 53 % (ref 43.0–77.0)
PLATELETS: 192 10*3/uL (ref 150.0–400.0)
RBC: 4.85 Mil/uL (ref 4.22–5.81)
RDW: 13.5 % (ref 11.5–14.6)
WBC: 9.1 10*3/uL (ref 4.5–10.5)

## 2014-01-04 LAB — BASIC METABOLIC PANEL
BUN: 10 mg/dL (ref 6–23)
CHLORIDE: 104 meq/L (ref 96–112)
CO2: 27 meq/L (ref 19–32)
Calcium: 10.1 mg/dL (ref 8.4–10.5)
Creatinine, Ser: 0.9 mg/dL (ref 0.4–1.5)
GFR: 89.47 mL/min (ref >60.00)
Glucose, Bld: 90 mg/dL (ref 70–99)
Potassium: 4.9 mEq/L (ref 3.5–5.1)
Sodium: 140 mEq/L (ref 135–145)

## 2014-01-04 LAB — HEPATIC FUNCTION PANEL
ALK PHOS: 56 U/L (ref 39–117)
ALT: 30 U/L (ref 0–53)
AST: 20 U/L (ref 0–37)
Albumin: 4.5 g/dL (ref 3.5–5.2)
BILIRUBIN DIRECT: 0.1 mg/dL (ref 0.0–0.3)
Total Bilirubin: 0.7 mg/dL (ref 0.3–1.2)
Total Protein: 7.4 g/dL (ref 6.0–8.3)

## 2014-01-04 LAB — POCT URINALYSIS DIPSTICK
BILIRUBIN UA: NEGATIVE
Glucose, UA: NEGATIVE
Ketones, UA: NEGATIVE
Leukocytes, UA: NEGATIVE
NITRITE UA: NEGATIVE
PH UA: 5.5
Protein, UA: NEGATIVE
RBC UA: NEGATIVE
Urobilinogen, UA: 0.2

## 2014-01-04 LAB — LIPID PANEL
CHOL/HDL RATIO: 4
CHOLESTEROL: 172 mg/dL (ref 0–200)
HDL: 39.1 mg/dL (ref >39.00)
LDL Cholesterol: 105 mg/dL — ABNORMAL HIGH (ref 0–99)
Triglycerides: 141 mg/dL (ref 0.0–149.0)
VLDL: 28.2 mg/dL (ref 0.0–40.0)

## 2014-01-04 LAB — PSA: PSA: 0.39 ng/mL (ref 0.10–4.00)

## 2014-01-04 LAB — TSH: TSH: 3.02 u[IU]/mL (ref 0.35–5.50)

## 2014-01-11 ENCOUNTER — Encounter: Payer: Self-pay | Admitting: Internal Medicine

## 2014-01-11 ENCOUNTER — Ambulatory Visit (INDEPENDENT_AMBULATORY_CARE_PROVIDER_SITE_OTHER): Payer: BC Managed Care – PPO | Admitting: Internal Medicine

## 2014-01-11 VITALS — BP 168/82 | HR 96 | Temp 98.1°F | Ht 75.0 in | Wt 224.0 lb

## 2014-01-11 DIAGNOSIS — Z0000 Encounter for general adult medical examination without abnormal findings: Secondary | ICD-10-CM

## 2014-01-11 MED ORDER — LOSARTAN POTASSIUM-HCTZ 100-25 MG PO TABS: 1.0000 | ORAL_TABLET | Freq: Every day | ORAL | Status: DC
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
## 2014-01-11 NOTE — Progress Notes (Signed)
Pre visit review using our clinic review tool, if applicable. No additional management support is needed unless otherwise documented below in the visit note. 

## 2014-01-29 ENCOUNTER — Telehealth: Payer: Self-pay | Admitting: Internal Medicine

## 2014-01-29 NOTE — Telephone Encounter (Signed)
Relevant patient education mailed to patient.  

## 2014-07-29 ENCOUNTER — Encounter: Payer: Self-pay | Admitting: Family Medicine

## 2014-07-29 ENCOUNTER — Ambulatory Visit (INDEPENDENT_AMBULATORY_CARE_PROVIDER_SITE_OTHER): Payer: BC Managed Care – PPO | Admitting: Family Medicine

## 2014-07-29 VITALS — BP 130/94 | HR 81 | Temp 98.6°F | Ht 75.0 in | Wt 224.0 lb

## 2014-07-29 DIAGNOSIS — M542 Cervicalgia: Secondary | ICD-10-CM

## 2014-07-29 DIAGNOSIS — N200 Calculus of kidney: Secondary | ICD-10-CM

## 2014-07-29 DIAGNOSIS — R82998 Other abnormal findings in urine: Secondary | ICD-10-CM

## 2014-07-29 DIAGNOSIS — F17200 Nicotine dependence, unspecified, uncomplicated: Secondary | ICD-10-CM

## 2014-07-29 LAB — URINALYSIS, MICROSCOPIC ONLY: RBC / HPF: NONE SEEN (ref 0–?)

## 2014-07-29 LAB — BASIC METABOLIC PANEL
BUN: 14 mg/dL (ref 6–23)
CHLORIDE: 105 meq/L (ref 96–112)
CO2: 27 meq/L (ref 19–32)
CREATININE: 1.1 mg/dL (ref 0.4–1.5)
Calcium: 9.5 mg/dL (ref 8.4–10.5)
GFR: 77.62 mL/min (ref >60.00)
GLUCOSE: 64 mg/dL — AB (ref 70–99)
Potassium: 3.9 mEq/L (ref 3.5–5.1)
Sodium: 139 mEq/L (ref 135–145)

## 2014-07-29 LAB — POCT URINALYSIS DIPSTICK
BILIRUBIN UA: NEGATIVE
Blood, UA: NEGATIVE
Glucose, UA: NEGATIVE
KETONES UA: NEGATIVE
Leukocytes, UA: NEGATIVE
Nitrite, UA: NEGATIVE
PH UA: 6
PROTEIN UA: NEGATIVE
SPEC GRAV UA: 1.015
Urobilinogen, UA: 0.2

## 2014-07-29 NOTE — Patient Instructions (Signed)
-  We have ordered labs or studies at this visit. It can take up to 1-2 weeks for results and processing. We will contact you with instructions IF your results are abnormal. Normal results will be released to your [facility]. If you have not heard from [place] or can not find your results in [facility] in 2 weeks please contact our office.  -do the exercises provided   -follow up with me or Dr. Yong Channel in 1 month
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
## 2014-07-29 NOTE — Addendum Note (Signed)
Addended by: Joyce Gross R on: 07/29/2014 02:17 PM   Modules accepted: Orders

## 2014-07-29 NOTE — Progress Notes (Signed)
Pre visit review using our clinic review tool, if applicable. No additional management support is needed unless otherwise documented below in the visit note. 

## 2014-07-31 ENCOUNTER — Encounter: Payer: Self-pay | Admitting: Family Medicine

## 2014-07-31 LAB — URINE CULTURE
Colony Count: NO GROWTH
Organism ID, Bacteria: NO GROWTH

## 2014-08-01 ENCOUNTER — Encounter: Payer: Self-pay | Admitting: Family Medicine

## 2014-08-01 DIAGNOSIS — M795 Residual foreign body in soft tissue: Secondary | ICD-10-CM

## 2014-08-01 HISTORY — DX: Residual foreign body in soft tissue: M79.5

## 2014-08-03 ENCOUNTER — Encounter: Payer: Self-pay | Admitting: Internal Medicine

## 2014-08-29 ENCOUNTER — Encounter: Payer: Self-pay | Admitting: Family Medicine

## 2014-08-29 ENCOUNTER — Ambulatory Visit: Payer: BC Managed Care – PPO | Admitting: Family Medicine

## 2014-08-29 ENCOUNTER — Ambulatory Visit (INDEPENDENT_AMBULATORY_CARE_PROVIDER_SITE_OTHER): Payer: BC Managed Care – PPO | Admitting: Family Medicine

## 2014-08-29 VITALS — BP 130/80 | HR 98 | Temp 98.1°F | Ht 75.0 in | Wt 230.0 lb

## 2014-08-29 DIAGNOSIS — E785 Hyperlipidemia, unspecified: Secondary | ICD-10-CM

## 2014-08-29 DIAGNOSIS — F17200 Nicotine dependence, unspecified, uncomplicated: Secondary | ICD-10-CM

## 2014-08-29 DIAGNOSIS — N200 Calculus of kidney: Secondary | ICD-10-CM

## 2014-08-29 DIAGNOSIS — I10 Essential (primary) hypertension: Secondary | ICD-10-CM

## 2014-08-29 MED ORDER — BUPROPION HCL ER (XL) 150 MG PO TB24: 150.0000 mg | ORAL_TABLET | Freq: Every day | ORAL | Status: DC
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
## 2014-08-29 NOTE — Progress Notes (Signed)
Pre visit review using our clinic review tool, if applicable. No additional management support is needed unless otherwise documented below in the visit note. 

## 2014-08-29 NOTE — Patient Instructions (Addendum)
Tobacco use: -start the wellbutrin once daily -QUIT DATE: Labor day -get rid of all cigarette and pick activity to do when you get cravings BEFORE the quit day  Follow up 6 weeks

## 2014-09-09 ENCOUNTER — Ambulatory Visit (INDEPENDENT_AMBULATORY_CARE_PROVIDER_SITE_OTHER): Payer: BC Managed Care – PPO | Admitting: Family Medicine

## 2014-09-09 ENCOUNTER — Encounter: Payer: Self-pay | Admitting: Family Medicine

## 2014-09-09 VITALS — BP 124/82 | HR 82 | Temp 98.2°F | Ht 75.0 in | Wt 227.5 lb

## 2014-09-09 DIAGNOSIS — H00016 Hordeolum externum left eye, unspecified eyelid: Secondary | ICD-10-CM

## 2014-09-09 DIAGNOSIS — J310 Chronic rhinitis: Secondary | ICD-10-CM

## 2014-09-09 DIAGNOSIS — H00019 Hordeolum externum unspecified eye, unspecified eyelid: Secondary | ICD-10-CM

## 2014-09-09 DIAGNOSIS — J329 Chronic sinusitis, unspecified: Secondary | ICD-10-CM

## 2014-09-09 DIAGNOSIS — F17200 Nicotine dependence, unspecified, uncomplicated: Secondary | ICD-10-CM

## 2014-09-09 DIAGNOSIS — J209 Acute bronchitis, unspecified: Secondary | ICD-10-CM

## 2014-09-09 MED ORDER — PREDNISONE 20 MG PO TABS: 40.0000 mg | ORAL_TABLET | Freq: Every day | ORAL | Status: DC

## 2014-09-09 NOTE — Progress Notes (Signed)
Pre visit review using our clinic review tool, if applicable. No additional management support is needed unless otherwise documented below in the visit note. 
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
## 2014-09-09 NOTE — Patient Instructions (Signed)
Complete the prednisone course -As we discussed, we have prescribed a new medication for you at this appointment. We discussed the common and serious potential adverse effects of this medication and you can review these and more with the pharmacist when you pick up your medication.  Please follow the instructions for use carefully and notify us immediately if you have any problems taking this medication.  Zyrtec daily  Quit smoking  Compresses to eye

## 2014-10-05 ENCOUNTER — Encounter: Payer: Self-pay | Admitting: Family Medicine

## 2014-10-05 ENCOUNTER — Ambulatory Visit (INDEPENDENT_AMBULATORY_CARE_PROVIDER_SITE_OTHER): Payer: BC Managed Care – PPO | Admitting: Family Medicine

## 2014-10-05 VITALS — BP 142/90 | HR 75 | Temp 98.0°F | Ht 75.0 in | Wt 221.0 lb

## 2014-10-05 DIAGNOSIS — I10 Essential (primary) hypertension: Secondary | ICD-10-CM

## 2014-10-05 DIAGNOSIS — Z23 Encounter for immunization: Secondary | ICD-10-CM

## 2014-10-05 MED ORDER — AMLODIPINE BESYLATE 5 MG PO TABS: 5.0000 mg | ORAL_TABLET | Freq: Every day | ORAL | Status: DC

## 2014-10-05 NOTE — Addendum Note (Signed)
Addended by: Agnes Lawrence on: 10/05/2014 08:47 AM   Modules accepted: Orders

## 2014-10-05 NOTE — Patient Instructions (Signed)
-  please start the norvasc 5 mg daily  -follow up in 2-4 weeks to recheck blood pressure
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
## 2014-10-05 NOTE — Progress Notes (Signed)
Pre visit review using our clinic review tool, if applicable. No additional management support is needed unless otherwise documented below in the visit note. 

## 2014-10-06 ENCOUNTER — Telehealth: Payer: Self-pay | Admitting: Family Medicine

## 2014-10-06 NOTE — Telephone Encounter (Signed)
emmi emailed °

## 2014-10-11 ENCOUNTER — Encounter: Payer: Self-pay | Admitting: Internal Medicine

## 2014-10-14 ENCOUNTER — Ambulatory Visit: Payer: BC Managed Care – PPO | Admitting: Family Medicine

## 2014-10-26 ENCOUNTER — Ambulatory Visit (INDEPENDENT_AMBULATORY_CARE_PROVIDER_SITE_OTHER): Payer: BC Managed Care – PPO | Admitting: Family Medicine

## 2014-10-26 ENCOUNTER — Encounter: Payer: Self-pay | Admitting: Family Medicine

## 2014-10-26 VITALS — BP 132/88 | HR 80 | Temp 98.1°F | Ht 75.0 in | Wt 223.2 lb

## 2014-10-26 DIAGNOSIS — I10 Essential (primary) hypertension: Secondary | ICD-10-CM

## 2014-10-26 NOTE — Patient Instructions (Signed)
-  Blood Pressure is at goal today  -Please continue the current blood pressure medications ad cut back on sodium, alcohol and smoking  -follow in January or February for Preventive visit and follow up

## 2014-10-26 NOTE — Progress Notes (Signed)
Pre visit review using our clinic review tool, if applicable. No additional management support is needed unless otherwise documented below in the visit note. 
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
## 2014-10-27 ENCOUNTER — Telehealth: Payer: Self-pay | Admitting: Family Medicine

## 2014-10-27 NOTE — Telephone Encounter (Signed)
emmi emailed °

## 2014-12-24 ENCOUNTER — Other Ambulatory Visit: Payer: Self-pay | Admitting: Internal Medicine

## 2014-12-25 ENCOUNTER — Other Ambulatory Visit: Payer: Self-pay | Admitting: Family Medicine

## 2015-01-23 ENCOUNTER — Encounter: Payer: Self-pay | Admitting: Family Medicine

## 2015-01-23 ENCOUNTER — Ambulatory Visit (INDEPENDENT_AMBULATORY_CARE_PROVIDER_SITE_OTHER): Payer: BLUE CROSS/BLUE SHIELD | Admitting: Family Medicine

## 2015-01-23 ENCOUNTER — Telehealth: Payer: Self-pay | Admitting: Family Medicine

## 2015-01-23 VITALS — BP 132/88 | HR 84 | Temp 98.1°F | Ht 76.5 in | Wt 231.4 lb

## 2015-01-23 DIAGNOSIS — I10 Essential (primary) hypertension: Secondary | ICD-10-CM | POA: Diagnosis not present

## 2015-01-23 DIAGNOSIS — Z720 Tobacco use: Secondary | ICD-10-CM

## 2015-01-23 DIAGNOSIS — Z0000 Encounter for general adult medical examination without abnormal findings: Secondary | ICD-10-CM | POA: Diagnosis not present

## 2015-01-23 DIAGNOSIS — E785 Hyperlipidemia, unspecified: Secondary | ICD-10-CM | POA: Diagnosis not present

## 2015-01-23 DIAGNOSIS — Z23 Encounter for immunization: Secondary | ICD-10-CM

## 2015-01-23 DIAGNOSIS — F17200 Nicotine dependence, unspecified, uncomplicated: Secondary | ICD-10-CM

## 2015-01-23 LAB — BASIC METABOLIC PANEL
BUN: 16 mg/dL (ref 6–23)
CO2: 26 mEq/L (ref 19–32)
CREATININE: 0.88 mg/dL (ref 0.40–1.50)
Calcium: 10 mg/dL (ref 8.4–10.5)
Chloride: 106 mEq/L (ref 96–112)
GFR: 95 mL/min (ref >60.00)
Glucose, Bld: 114 mg/dL — ABNORMAL HIGH (ref 70–99)
POTASSIUM: 4.8 meq/L (ref 3.5–5.1)
SODIUM: 139 meq/L (ref 135–145)

## 2015-01-23 LAB — LIPID PANEL
CHOL/HDL RATIO: 4
CHOLESTEROL: 166 mg/dL (ref 0–200)
HDL: 46.7 mg/dL (ref >39.00)
LDL Cholesterol: 89 mg/dL (ref 0–99)
NonHDL: 119.3
Triglycerides: 152 mg/dL — ABNORMAL HIGH (ref 0.0–149.0)
VLDL: 30.4 mg/dL (ref 0.0–40.0)

## 2015-01-23 LAB — HEMOGLOBIN A1C: Hgb A1c MFr Bld: 5.9 % (ref 4.6–6.5)

## 2015-01-23 NOTE — Addendum Note (Signed)
Addended by: Agnes Lawrence on: 01/23/2015 09:19 AM   Modules accepted: Orders

## 2015-01-23 NOTE — Progress Notes (Signed)
Pre visit review using our clinic review tool, if applicable. No additional management support is needed unless otherwise documented below in the visit note. 
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
## 2015-01-23 NOTE — Telephone Encounter (Signed)
Opened in error

## 2015-01-23 NOTE — Patient Instructions (Addendum)
BEFORE YOU LEAVE: -Tdap vaccine -labs -follow up in 3 months  -We placed a referral for you as discussed for the lung cancer screening. It usually takes about 1-2 weeks to process and schedule this referral. If you have not heard from [place] regarding this appointment in 2 weeks please contact our office.  -Quit smoking, please let us know how we can help you with this  We recommend the following healthy lifestyle measures: - eat a healthy diet consisting of lots of vegetables, fruits, beans, nuts, seeds, healthy meats such as white chicken and fish and whole grains.  - avoid fried foods, fast food, processed foods, sodas, red meet and other fattening foods.  - get a least 150 minutes of aerobic exercise per week.

## 2015-01-30 ENCOUNTER — Ambulatory Visit (INDEPENDENT_AMBULATORY_CARE_PROVIDER_SITE_OTHER)
Admission: RE | Admit: 2015-01-30 | Discharge: 2015-01-30 | Disposition: A | Discharge status: Home / Self Care | Payer: BLUE CROSS/BLUE SHIELD | Source: Ambulatory Visit | Attending: Family Medicine | Admitting: Family Medicine

## 2015-01-30 DIAGNOSIS — Z720 Tobacco use: Secondary | ICD-10-CM

## 2015-01-30 DIAGNOSIS — F17200 Nicotine dependence, unspecified, uncomplicated: Secondary | ICD-10-CM

## 2015-04-24 ENCOUNTER — Encounter: Payer: Self-pay | Admitting: Family Medicine

## 2015-04-24 ENCOUNTER — Ambulatory Visit (INDEPENDENT_AMBULATORY_CARE_PROVIDER_SITE_OTHER): Payer: BLUE CROSS/BLUE SHIELD | Admitting: Family Medicine

## 2015-04-24 VITALS — BP 138/82 | HR 77 | Temp 98.1°F | Ht 76.5 in | Wt 221.8 lb

## 2015-04-24 DIAGNOSIS — I779 Disorder of arteries and arterioles, unspecified: Secondary | ICD-10-CM

## 2015-04-24 DIAGNOSIS — B356 Tinea cruris: Secondary | ICD-10-CM

## 2015-04-24 DIAGNOSIS — R1084 Generalized abdominal pain: Secondary | ICD-10-CM

## 2015-04-24 DIAGNOSIS — I10 Essential (primary) hypertension: Secondary | ICD-10-CM | POA: Diagnosis not present

## 2015-04-24 DIAGNOSIS — I739 Peripheral vascular disease, unspecified: Secondary | ICD-10-CM

## 2015-04-24 DIAGNOSIS — F17200 Nicotine dependence, unspecified, uncomplicated: Secondary | ICD-10-CM

## 2015-04-24 DIAGNOSIS — Z720 Tobacco use: Secondary | ICD-10-CM

## 2015-04-24 LAB — POCT URINALYSIS DIPSTICK
Bilirubin, UA: NEGATIVE
Glucose, UA: NEGATIVE
KETONES UA: NEGATIVE
LEUKOCYTES UA: NEGATIVE
Nitrite, UA: NEGATIVE
Protein, UA: NEGATIVE
RBC UA: NEGATIVE
Spec Grav, UA: 1.015
Urobilinogen, UA: 0.2
pH, UA: 5.5

## 2015-04-24 MED ORDER — NYSTATIN-TRIAMCINOLONE 100000-0.1 UNIT/GM-% EX CREA: TOPICAL_CREAM | Freq: Two times a day (BID) | CUTANEOUS | Status: DC
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
## 2015-04-24 NOTE — Addendum Note (Signed)
Addended by: Agnes Lawrence on: 04/24/2015 09:31 AM   Modules accepted: Orders

## 2015-04-24 NOTE — Patient Instructions (Signed)
BEFORE YOU LEAVE: -urine dip and micro if abnormal  -We placed a referral for you as discussed to look at the arteries in your neck. It usually takes about 1-2 weeks to process and schedule this referral. If you have not heard from [place] regarding this appointment in 2 weeks please contact our office.  -Please quit smoking  We recommend the following healthy lifestyle measures: - eat a healthy diet consisting of lots of vegetables, fruits, beans, nuts, seeds, healthy meats such as white chicken and fish and whole grains.  - avoid fried foods, fast food, processed foods, sodas, red meet and other fattening foods.  - get a least 150 minutes of aerobic exercise per week.   If abdominal pain worsening seek care immediately, follow up if persists.

## 2015-04-24 NOTE — Progress Notes (Signed)
Pre visit review using our clinic review tool, if applicable. No additional management support is needed unless otherwise documented below in the visit note. 

## 2015-05-01 ENCOUNTER — Encounter (HOSPITAL_COMMUNITY): Payer: PRIVATE HEALTH INSURANCE

## 2015-05-01 ENCOUNTER — Ambulatory Visit (HOSPITAL_COMMUNITY): Payer: BLUE CROSS/BLUE SHIELD | Attending: Cardiology

## 2015-05-01 DIAGNOSIS — I779 Disorder of arteries and arterioles, unspecified: Secondary | ICD-10-CM | POA: Diagnosis present

## 2015-05-01 DIAGNOSIS — I6523 Occlusion and stenosis of bilateral carotid arteries: Secondary | ICD-10-CM | POA: Diagnosis not present

## 2015-05-01 DIAGNOSIS — I739 Peripheral vascular disease, unspecified: Secondary | ICD-10-CM

## 2015-05-30 ENCOUNTER — Encounter: Payer: Self-pay | Admitting: Family Medicine

## 2015-05-30 ENCOUNTER — Ambulatory Visit (INDEPENDENT_AMBULATORY_CARE_PROVIDER_SITE_OTHER): Payer: BLUE CROSS/BLUE SHIELD | Admitting: Family Medicine

## 2015-05-30 VITALS — BP 140/78 | HR 80 | Temp 98.2°F | Ht 76.5 in | Wt 222.7 lb

## 2015-05-30 DIAGNOSIS — R1013 Epigastric pain: Secondary | ICD-10-CM

## 2015-05-30 LAB — CBC WITH DIFFERENTIAL/PLATELET
BASOS PCT: 0.4 % (ref 0.0–3.0)
Basophils Absolute: 0 10*3/uL (ref 0.0–0.1)
EOS PCT: 4.4 % (ref 0.0–5.0)
Eosinophils Absolute: 0.4 10*3/uL (ref 0.0–0.7)
HEMATOCRIT: 45.6 % (ref 39.0–52.0)
HEMOGLOBIN: 15.6 g/dL (ref 13.0–17.0)
LYMPHS ABS: 2.8 10*3/uL (ref 0.7–4.0)
Lymphocytes Relative: 29.6 % (ref 12.0–46.0)
MCHC: 34.1 g/dL (ref 30.0–36.0)
MCV: 96.8 fl (ref 78.0–100.0)
Monocytes Absolute: 0.9 10*3/uL (ref 0.1–1.0)
Monocytes Relative: 9.7 % (ref 3.0–12.0)
Neutro Abs: 5.4 10*3/uL (ref 1.4–7.7)
Neutrophils Relative %: 55.9 % (ref 43.0–77.0)
Platelets: 215 10*3/uL (ref 150.0–400.0)
RBC: 4.71 Mil/uL (ref 4.22–5.81)
RDW: 12.8 % (ref 11.5–15.5)
WBC: 9.6 10*3/uL (ref 4.0–10.5)

## 2015-05-30 LAB — COMPREHENSIVE METABOLIC PANEL
ALK PHOS: 53 U/L (ref 39–117)
ALT: 30 U/L (ref 0–53)
AST: 18 U/L (ref 0–37)
Albumin: 4.3 g/dL (ref 3.5–5.2)
BUN: 13 mg/dL (ref 6–23)
CO2: 28 mEq/L (ref 19–32)
Calcium: 9.6 mg/dL (ref 8.4–10.5)
Chloride: 103 mEq/L (ref 96–112)
Creatinine, Ser: 0.88 mg/dL (ref 0.40–1.50)
GFR: 94.88 mL/min (ref >60.00)
GLUCOSE: 80 mg/dL (ref 70–99)
POTASSIUM: 3.7 meq/L (ref 3.5–5.1)
Sodium: 138 mEq/L (ref 135–145)
Total Bilirubin: 0.6 mg/dL (ref 0.2–1.2)
Total Protein: 7.5 g/dL (ref 6.0–8.3)

## 2015-05-30 NOTE — Patient Instructions (Signed)
BEFORE YOU LEAVE: -labs  -We placed a referral for you as discussed to the gastroenterologist. It usually takes about 1-2 weeks to process and schedule this referral. If you have not heard from [place] regarding this appointment in 2 weeks please contact our office.  -Take Nexium (available over the counter) 1 tablet daily until you see the gastroenterologist  -Take Align probiotic as instructed (available) over the counter  -seek care immediatly if worsening, blood in stools, vomiting blood, severe pain, fevers or worsening and follow up if any concerns
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
## 2015-05-30 NOTE — Progress Notes (Signed)
Pre visit review using our clinic review tool, if applicable. No additional management support is needed unless otherwise documented below in the visit note. 

## 2015-06-01 ENCOUNTER — Ambulatory Visit (INDEPENDENT_AMBULATORY_CARE_PROVIDER_SITE_OTHER): Payer: BLUE CROSS/BLUE SHIELD | Admitting: Physician Assistant

## 2015-06-01 ENCOUNTER — Encounter: Payer: Self-pay | Admitting: Physician Assistant

## 2015-06-01 VITALS — BP 134/70 | HR 76 | Ht 76.0 in | Wt 221.5 lb

## 2015-06-01 DIAGNOSIS — R1013 Epigastric pain: Secondary | ICD-10-CM | POA: Diagnosis not present

## 2015-06-01 DIAGNOSIS — Z8711 Personal history of peptic ulcer disease: Secondary | ICD-10-CM | POA: Diagnosis not present

## 2015-06-01 MED ORDER — PANTOPRAZOLE SODIUM 40 MG PO TBEC: 40.0000 mg | DELAYED_RELEASE_TABLET | Freq: Every day | ORAL | Status: DC
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
## 2015-06-01 NOTE — Patient Instructions (Addendum)
Please go to the basement level to our lab for the stool test kit and directions. Take 2 capsules of Nexium 20 mg daily for now.  We sent a prescription for Pantoprazole sodium 40 mg to [facility].  Decrease aspirin to 81 mg by mouth every morning for now.  You have been scheduled for an endoscopy. Please follow written instructions given to you at your visit today. If you use inhalers (even only as needed), please bring them with you on the day of your procedure. Your physician has requested that you go to www.startemmi.com and enter the access code given to you at your visit today. This web site gives a general overview about your procedure. However, you should still follow specific instructions given to you by our office regarding your preparation for the procedure.         Normal BMI (Body Mass Index- based on height and weight) is between 19 and 25. Your BMI today is Body mass index is 26.97 kg/(m^2). Marland Kitchen Please consider follow up  regarding your BMI with your Primary Care Provider.

## 2015-06-01 NOTE — Progress Notes (Signed)
Agree with initial assessment and plans 

## 2015-06-02 ENCOUNTER — Other Ambulatory Visit: Payer: BLUE CROSS/BLUE SHIELD

## 2015-06-02 DIAGNOSIS — R1013 Epigastric pain: Secondary | ICD-10-CM

## 2015-06-02 DIAGNOSIS — Z8711 Personal history of peptic ulcer disease: Secondary | ICD-10-CM

## 2015-06-04 LAB — H. PYLORI ANTIGEN, STOOL: H PYLORI AG STL: NEGATIVE

## 2015-06-07 ENCOUNTER — Encounter: Payer: Self-pay | Admitting: Internal Medicine

## 2015-06-07 ENCOUNTER — Ambulatory Visit (AMBULATORY_SURGERY_CENTER): Payer: BLUE CROSS/BLUE SHIELD | Admitting: Internal Medicine

## 2015-06-07 VITALS — BP 180/99 | HR 72 | Temp 98.1°F | Resp 18 | Ht 76.0 in | Wt 221.0 lb

## 2015-06-07 DIAGNOSIS — K269 Duodenal ulcer, unspecified as acute or chronic, without hemorrhage or perforation: Secondary | ICD-10-CM

## 2015-06-07 DIAGNOSIS — R1013 Epigastric pain: Secondary | ICD-10-CM

## 2015-06-07 MED ORDER — SODIUM CHLORIDE 0.9 % IV SOLN: 500.0000 mL | INTRAVENOUS | Status: DC

## 2015-06-07 NOTE — Progress Notes (Signed)
Called to room to assist during endoscopic procedure.  Patient ID and intended procedure confirmed with present staff. Received instructions for my participation in the procedure from the performing physician.  

## 2015-06-07 NOTE — Progress Notes (Signed)
Report to PACU, RN, vss, BBS= Clear.  

## 2015-06-07 NOTE — Op Note (Signed)
[place]  [address]. [place], [id number]   ENDOSCOPY PROCEDURE REPORT  PATIENT: Darin, Hunter  MR#: [id number] BIRTHDATE: March 03, 1956 , 77  yrs. old GENDER: male ENDOSCOPIST: Eustace Quail, MD REFERRED BY:  Colin Benton, D.O. PROCEDURE DATE:  06/07/2015 PROCEDURE:  EGD w/ biopsy ASA CLASS:     Class II INDICATIONS:  epigastric pain.  . Feeling better since initiating PPI therapy recently MEDICATIONS: Monitored anesthesia care and Propofol 200 mg IV TOPICAL ANESTHETIC: none  DESCRIPTION OF PROCEDURE: After the risks benefits and alternatives of the procedure were thoroughly explained, informed consent was obtained.  The LB [id number] endoscope was introduced through the mouth and advanced to the second portion of the duodenum , Without limitations.  The instrument was slowly withdrawn as the mucosa was fully examined.  The esophagus revealed mild distal esophagitis as well as large caliber ringlike peptic stricture.  The stomach was unremarkable. The duodenum revealed a 1 cm ulcer with surrounding duodenitis. Post bulbar duodenum was normal.  CLO biopsy taken.  Retroflexed views revealed a hiatal hernia.     The scope was then withdrawn from the patient and the procedure completed.  COMPLICATIONS: There were no immediate complications.  ENDOSCOPIC IMPRESSION: 1. Acute duodenal ulcer 2. GERD with esophagitis and stricture  RECOMMENDATIONS: 1. Continue proton pump inhibitor therapy (Nexium) daily indefinitely 2. Follow-up CLO test and treat if positive 3. Stop smoking. This contributes to ulcers 4. Okay to take aspirin for cardiovascular and cerebrovascular protection. However, should decrease to baby aspirin dose of     81 mg 5. Return to the care of your primary provider. GI follow-up as needed  REPEAT EXAM:  eSigned:  Eustace Quail, MD 06/07/2015 1:43 PM    PP:HKFEXM Maudie Mercury, MD and The Patient

## 2015-06-07 NOTE — Patient Instructions (Signed)
YOU HAD AN ENDOSCOPIC PROCEDURE TODAY AT [facility]:   Refer to the procedure report that was given to you for any specific questions about what was found during the examination.  If the procedure report does not answer your questions, please call your gastroenterologist to clarify.  If you requested that your care partner not be given the details of your procedure findings, then the procedure report has been included in a sealed envelope for you to review at your convenience later.  YOU SHOULD EXPECT: Some feelings of bloating in the abdomen. Passage of more gas than usual.  Walking can help get rid of the air that was put into your GI tract during the procedure and reduce the bloating. If you had a lower endoscopy (such as a colonoscopy or flexible sigmoidoscopy) you may notice spotting of blood in your stool or on the toilet paper. If you underwent a bowel prep for your procedure, you may not have a normal bowel movement for a few days.  Please Note:  You might notice some irritation and congestion in your nose or some drainage.  This is from the oxygen used during your procedure.  There is no need for concern and it should clear up in a day or so.  SYMPTOMS TO REPORT IMMEDIATELY:   Following upper endoscopy (EGD)  Vomiting of blood or coffee ground material  New chest pain or pain under the shoulder blades  Painful or persistently difficult swallowing  New shortness of breath  Fever of 100F or higher  Black, tarry-looking stools  For urgent or emergent issues, a gastroenterologist can be reached at any hour by calling [phone].   DIET: Your first meal following the procedure should be a small meal and then it is ok to progress to your normal diet. Heavy or fried foods are harder to digest and may make you feel nauseous or bloated.  Likewise, meals heavy in dairy and vegetables can increase bloating.  Drink plenty of fluids but you should avoid alcoholic beverages for  24 hours.  ACTIVITY:  You should plan to take it easy for the rest of today and you should NOT DRIVE or use heavy machinery until tomorrow (because of the sedation medicines used during the test).    FOLLOW UP: Our staff will call the number listed on your records the next business day following your procedure to check on you and address any questions or concerns that you may have regarding the information given to you following your procedure. If we do not reach you, we will leave a message.  However, if you are feeling well and you are not experiencing any problems, there is no need to return our call.  We will assume that you have returned to your regular daily activities without incident.  If any biopsies were taken you will be contacted by phone or by letter within the next 1-3 weeks.  Please call us at [phone] if you have not heard about the biopsies in 3 weeks.    SIGNATURES/CONFIDENTIALITY: You and/or your care partner have signed paperwork which will be entered into your electronic medical record.  These signatures attest to the fact that that the information above on your After Visit Summary has been reviewed and is understood.  Full responsibility of the confidentiality of this discharge information lies with you and/or your care-partner.  Stricture, esophagitis, GERD, duodenal ulcer, and hiatal hernia information given. Continue Nexium.

## 2015-06-08 ENCOUNTER — Telehealth: Payer: Self-pay | Admitting: *Deleted

## 2015-06-08 LAB — HELICOBACTER PYLORI SCREEN-BIOPSY: UREASE: NEGATIVE

## 2015-06-08 NOTE — Telephone Encounter (Signed)
  Follow up Call-  Call back number 06/07/2015 10/08/2012  Post procedure Call Back phone  # [phone]  Permission to leave phone message Yes Yes     Patient questions:  Do you have a fever, pain , or abdominal swelling? No. Pain Score  0 *  Have you tolerated food without any problems? Yes.    Have you been able to return to your normal activities? Yes.    Do you have any questions about your discharge instructions: Diet   No. Medications  No. Follow up visit  No.  Do you have questions or concerns about your Care? No.  Actions: * If pain score is 4 or above: No action needed, pain <4.

## 2015-06-27 ENCOUNTER — Telehealth: Payer: Self-pay | Admitting: Internal Medicine

## 2015-06-27 NOTE — Telephone Encounter (Signed)
Patient's wife given biopsy results.

## 2015-06-27 NOTE — Telephone Encounter (Signed)
Patient is calling for biopsy results. Please, advise.

## 2015-06-27 NOTE — Telephone Encounter (Signed)
H pylori negative

## 2015-07-24 ENCOUNTER — Ambulatory Visit (INDEPENDENT_AMBULATORY_CARE_PROVIDER_SITE_OTHER): Payer: BLUE CROSS/BLUE SHIELD | Admitting: Family Medicine

## 2015-07-24 ENCOUNTER — Encounter: Payer: Self-pay | Admitting: Family Medicine

## 2015-07-24 ENCOUNTER — Ambulatory Visit (INDEPENDENT_AMBULATORY_CARE_PROVIDER_SITE_OTHER)
Admission: RE | Admit: 2015-07-24 | Discharge: 2015-07-24 | Disposition: A | Discharge status: Home / Self Care | Payer: BLUE CROSS/BLUE SHIELD | Source: Ambulatory Visit | Attending: Family Medicine | Admitting: Family Medicine

## 2015-07-24 VITALS — BP 140/82 | HR 90 | Temp 98.3°F | Ht 76.5 in | Wt 221.6 lb

## 2015-07-24 DIAGNOSIS — M25561 Pain in right knee: Secondary | ICD-10-CM

## 2015-07-24 DIAGNOSIS — F17200 Nicotine dependence, unspecified, uncomplicated: Secondary | ICD-10-CM

## 2015-07-24 DIAGNOSIS — I779 Disorder of arteries and arterioles, unspecified: Secondary | ICD-10-CM | POA: Diagnosis not present

## 2015-07-24 DIAGNOSIS — I739 Peripheral vascular disease, unspecified: Secondary | ICD-10-CM

## 2015-07-24 DIAGNOSIS — Z720 Tobacco use: Secondary | ICD-10-CM | POA: Diagnosis not present

## 2015-07-24 DIAGNOSIS — I10 Essential (primary) hypertension: Secondary | ICD-10-CM
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
## 2015-07-24 NOTE — Patient Instructions (Addendum)
BEFORE YOU LEAVE: -xray sheet -knee exercises -follow up in 3-4 months  Get xray of the knee  Do the exercises 3-4 days per week and call in 2 weeks to update [place] on our knee pain - sooner if worsening, giving away, locking or other concerns  We recommend the following healthy lifestyle measures: - eat a healthy diet consisting of lots of vegetables, fruits, beans, nuts, seeds, healthy meats such as white chicken and fish and whole grains.  - avoid fried foods, fast food, processed foods, sodas, red meet and other fattening foods.  - get a least 150 minutes of aerobic exercise per week.

## 2015-07-24 NOTE — Progress Notes (Signed)
Pre visit review using our clinic review tool, if applicable. No additional management support is needed unless otherwise documented below in the visit note. 

## 2015-07-24 NOTE — Addendum Note (Signed)
Addended by: Lucretia Kern on: 07/24/2015 01:35 PM   Modules accepted: Orders

## 2015-09-24 ENCOUNTER — Other Ambulatory Visit: Payer: Self-pay | Admitting: Family Medicine

## 2015-10-24 ENCOUNTER — Ambulatory Visit (INDEPENDENT_AMBULATORY_CARE_PROVIDER_SITE_OTHER): Payer: BLUE CROSS/BLUE SHIELD | Admitting: Family Medicine

## 2015-10-24 ENCOUNTER — Encounter: Payer: Self-pay | Admitting: Family Medicine

## 2015-10-24 VITALS — BP 126/78 | HR 83 | Temp 97.6°F | Ht 76.5 in | Wt 224.5 lb

## 2015-10-24 DIAGNOSIS — Z23 Encounter for immunization: Secondary | ICD-10-CM

## 2015-10-24 DIAGNOSIS — I10 Essential (primary) hypertension: Secondary | ICD-10-CM | POA: Diagnosis not present

## 2015-10-24 DIAGNOSIS — I779 Disorder of arteries and arterioles, unspecified: Secondary | ICD-10-CM | POA: Diagnosis not present

## 2015-10-24 DIAGNOSIS — F17200 Nicotine dependence, unspecified, uncomplicated: Secondary | ICD-10-CM | POA: Diagnosis not present

## 2015-10-24 DIAGNOSIS — I739 Peripheral vascular disease, unspecified: Secondary | ICD-10-CM
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
## 2015-10-24 NOTE — Patient Instructions (Signed)
BEFORE YOU LEAVE: -flu shot -schedule physical in February 2017 - come fasting for lab work that day  Stop smoking  We recommend the following healthy lifestyle measures: - eat a healthy whole foods diet consisting of regular small meals composed of vegetables, fruits, beans, nuts, seeds, healthy meats such as white chicken and fish and whole grains.  - avoid sweets, white starchy foods, fried foods, fast food, processed foods, sodas, red meet and other fattening foods.  - get a least 150-300 minutes of aerobic exercise per week.

## 2015-10-24 NOTE — Progress Notes (Signed)
Pre visit review using our clinic review tool, if applicable. No additional management support is needed unless otherwise documented below in the visit note. 

## 2015-12-22 ENCOUNTER — Other Ambulatory Visit: Payer: Self-pay | Admitting: Family Medicine

## 2016-02-27 ENCOUNTER — Encounter: Payer: Self-pay | Admitting: Family Medicine

## 2016-02-27 ENCOUNTER — Ambulatory Visit (INDEPENDENT_AMBULATORY_CARE_PROVIDER_SITE_OTHER): Payer: BLUE CROSS/BLUE SHIELD | Admitting: Family Medicine

## 2016-02-27 VITALS — BP 155/90 | HR 74 | Temp 98.4°F | Ht 76.0 in | Wt 220.9 lb

## 2016-02-27 DIAGNOSIS — I779 Disorder of arteries and arterioles, unspecified: Secondary | ICD-10-CM

## 2016-02-27 DIAGNOSIS — K219 Gastro-esophageal reflux disease without esophagitis: Secondary | ICD-10-CM

## 2016-02-27 DIAGNOSIS — I10 Essential (primary) hypertension: Secondary | ICD-10-CM

## 2016-02-27 DIAGNOSIS — Z7721 Contact with and (suspected) exposure to potentially hazardous body fluids: Secondary | ICD-10-CM

## 2016-02-27 DIAGNOSIS — Z0001 Encounter for general adult medical examination with abnormal findings: Secondary | ICD-10-CM

## 2016-02-27 DIAGNOSIS — I739 Peripheral vascular disease, unspecified: Secondary | ICD-10-CM

## 2016-02-27 DIAGNOSIS — F17200 Nicotine dependence, unspecified, uncomplicated: Secondary | ICD-10-CM | POA: Diagnosis not present

## 2016-02-27 DIAGNOSIS — B356 Tinea cruris: Secondary | ICD-10-CM

## 2016-02-27 DIAGNOSIS — Z0000 Encounter for general adult medical examination without abnormal findings: Secondary | ICD-10-CM

## 2016-02-27 HISTORY — DX: Gastro-esophageal reflux disease without esophagitis: K21.9

## 2016-02-27 LAB — COMPLETE METABOLIC PANEL WITH GFR
ALBUMIN: 4.3 g/dL (ref 3.6–5.1)
ALK PHOS: 51 U/L (ref 40–115)
ALT: 32 U/L (ref 9–46)
AST: 24 U/L (ref 10–35)
BILIRUBIN TOTAL: 0.6 mg/dL (ref 0.2–1.2)
BUN: 14 mg/dL (ref 7–25)
CO2: 27 mmol/L (ref 20–31)
CREATININE: 0.86 mg/dL (ref 0.70–1.33)
Calcium: 9.9 mg/dL (ref 8.6–10.3)
Chloride: 104 mmol/L (ref 98–110)
GLUCOSE: 102 mg/dL — AB (ref 65–99)
Potassium: 4.4 mmol/L (ref 3.5–5.3)
Sodium: 141 mmol/L (ref 135–146)
TOTAL PROTEIN: 7.4 g/dL (ref 6.1–8.1)

## 2016-02-27 LAB — LIPID PANEL
CHOLESTEROL: 163 mg/dL (ref 0–200)
HDL: 46.2 mg/dL (ref >39.00)
LDL Cholesterol: 96 mg/dL (ref 0–99)
NONHDL: 116.48
Total CHOL/HDL Ratio: 4
Triglycerides: 100 mg/dL (ref 0.0–149.0)
VLDL: 20 mg/dL (ref 0.0–40.0)

## 2016-02-27 LAB — PSA: PSA: 0.5 ng/mL (ref 0.10–4.00)

## 2016-02-27 LAB — HEMOGLOBIN A1C: Hgb A1c MFr Bld: 5.7 % (ref 4.6–6.5)

## 2016-02-27 MED ORDER — AMLODIPINE BESYLATE 5 MG PO TABS: ORAL_TABLET | ORAL | Status: DC

## 2016-02-27 MED ORDER — NYSTATIN-TRIAMCINOLONE 100000-0.1 UNIT/GM-% EX CREA: TOPICAL_CREAM | Freq: Two times a day (BID) | CUTANEOUS | Status: AC

## 2016-02-27 NOTE — Patient Instructions (Signed)
BEFORE YOU LEAVE: -schedule nurse visit for blood pressure recheck in 1 week - please take you blood pressure medications -labs -schedule follow up in 3-4 months  -quit smoking  -discuss your pantoprazole dosing with your gastroenterologist  -We have ordered labs or studies at this visit. It can take up to 1-2 weeks for results and processing. We will contact you with instructions IF your results are abnormal. Normal results will be released to your [facility]. If you have not heard from [place] or can not find your results in [facility] in 2 weeks please contact our office.  We recommend the following healthy lifestyle measures: - eat a healthy whole foods diet consisting of regular small meals composed of vegetables, fruits, beans, nuts, seeds, healthy meats such as white chicken and fish and whole grains.  - avoid sweets, white starchy foods, fried foods, fast food, processed foods, sodas, red meet and other fattening foods.  - get a least 150-300 minutes of aerobic exercise per week.
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
## 2016-02-27 NOTE — Progress Notes (Signed)
Pre visit review using our clinic review tool, if applicable. No additional management support is needed unless otherwise documented below in the visit note. 

## 2016-02-28 LAB — HEPATITIS PANEL, ACUTE
HCV Ab: NEGATIVE
HEP B C IGM: NONREACTIVE
Hep A IgM: NONREACTIVE
Hepatitis B Surface Ag: NEGATIVE

## 2016-02-28 LAB — HEPATITIS C ANTIBODY: HCV AB: NEGATIVE

## 2016-02-28 LAB — HIV ANTIBODY (ROUTINE TESTING W REFLEX): HIV 1&2 Ab, 4th Generation: NONREACTIVE

## 2016-03-05 ENCOUNTER — Encounter: Payer: Self-pay | Admitting: Family Medicine

## 2016-03-05 ENCOUNTER — Encounter: Payer: BLUE CROSS/BLUE SHIELD | Admitting: *Deleted

## 2016-03-05 ENCOUNTER — Ambulatory Visit (INDEPENDENT_AMBULATORY_CARE_PROVIDER_SITE_OTHER): Payer: BLUE CROSS/BLUE SHIELD | Admitting: Family Medicine

## 2016-03-05 VITALS — BP 142/90 | HR 84

## 2016-03-05 DIAGNOSIS — I10 Essential (primary) hypertension: Secondary | ICD-10-CM

## 2016-03-05 MED ORDER — AMLODIPINE BESYLATE 10 MG PO TABS: ORAL_TABLET | ORAL | Status: DC
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
## 2016-03-05 NOTE — Patient Instructions (Addendum)
Keep your scheduled follow-up.  These increase your Norvasc to 10 mg daily.  Please take all of your medications daily.  Please quit smoking  We recommend the following healthy lifestyle measures: - eat a healthy whole foods diet consisting of regular small meals composed of vegetables, fruits, beans, nuts, seeds, healthy meats such as white chicken and fish and whole grains.  - avoid sweets, white starchy foods, fried foods, fast food, processed foods, sodas, red meet and other fattening foods.  - get a least 150-300 minutes of aerobic exercise per week.

## 2016-03-22 ENCOUNTER — Telehealth: Payer: Self-pay | Admitting: Acute Care

## 2016-03-22 DIAGNOSIS — F17210 Nicotine dependence, cigarettes, uncomplicated: Secondary | ICD-10-CM

## 2016-03-22 NOTE — Telephone Encounter (Signed)
Spoke with pt's spouse, Nunzio Cory- returning a call to [place] or [employer] her at [phone] Thanks

## 2016-03-25 NOTE — Telephone Encounter (Signed)
Called spoke with pt. Discussed lung cancer screening program/questionnaire.  Pt does qualify for program and is scheduled for [facility] 03/26/16 w/ Sarah.

## 2016-03-25 NOTE — Telephone Encounter (Signed)
Routing to Lung Nodule Pool

## 2016-03-26 ENCOUNTER — Ambulatory Visit (INDEPENDENT_AMBULATORY_CARE_PROVIDER_SITE_OTHER)
Admission: RE | Admit: 2016-03-26 | Discharge: 2016-03-26 | Disposition: A | Discharge status: Home / Self Care | Payer: BLUE CROSS/BLUE SHIELD | Source: Ambulatory Visit | Attending: Acute Care | Admitting: Acute Care

## 2016-03-26 ENCOUNTER — Ambulatory Visit (INDEPENDENT_AMBULATORY_CARE_PROVIDER_SITE_OTHER): Payer: BLUE CROSS/BLUE SHIELD | Admitting: Acute Care

## 2016-03-26 ENCOUNTER — Encounter: Payer: Self-pay | Admitting: Acute Care

## 2016-03-26 DIAGNOSIS — F17210 Nicotine dependence, cigarettes, uncomplicated: Secondary | ICD-10-CM

## 2016-03-26 NOTE — Progress Notes (Signed)
Shared Decision Making Visit Lung Cancer Screening Program [phone])   Eligibility:  Age 60 y.o.  Pack Years Smoking History Calculation 102-pack-year smoking history (# packs/per year x # years smoked)  Recent History of coughing up blood  no  Unexplained weight loss? no ( >Than 15 pounds within the last 6 months )  Prior History Lung / other cancer no (Diagnosis within the last 5 years already requiring surveillance chest CT Scans).  Smoking Status Current Smoker  Former Smokers: Years since quit:NA  Quit Date: NA  Visit Components:  Discussion included one or more decision making aids. yes  Discussion included risk/benefits of screening. yes  Discussion included potential follow up diagnostic testing for abnormal scans. yes  Discussion included meaning and risk of over diagnosis. yes  Discussion included meaning and risk of False Positives. yes  Discussion included meaning of total radiation exposure. yes  Counseling Included:  Importance of adherence to annual lung cancer LDCT screening. yes  Impact of comorbidities on ability to participate in the program. yes  Ability and willingness to under diagnostic treatment. yes  Smoking Cessation Counseling:  Current Smokers:   Discussed importance of smoking cessation. yes  Information about tobacco cessation classes and interventions provided to patient. yes  Patient provided with "ticket" for LDCT Scan. yes  Symptomatic Patient. no  CounselingNA  Diagnosis Code: Tobacco Use Z72.0  Asymptomatic Patient yes  Counseling (Intermediate counseling: > three minutes counseling) [id number]  Former Smokers:   Discussed the importance of maintaining cigarette abstinence. NA; Current smoker  Diagnosis Code: Personal History of Nicotine Dependence. [id number]  Information about tobacco cessation classes and interventions provided to patient. Yes  Patient provided with "ticket" for LDCT Scan. yes  Written Order for  Lung Cancer Screening with LDCT placed in Epic. Yes (CT Chest Lung Cancer Screening Low Dose W/O CM) [id number] Z12.2-Screening of respiratory organs Z87.891-Personal history of nicotine dependence  I have spent 20 minutes of face to face time with  Darin Hunter discussing the risks and benefits of lung cancer screening. We viewed a power point together that explained in detail the above noted topics. We paused at intervals to allow for questions to be asked and answered to ensure understanding.We discussed that the single most powerful action that he can take to decrease his risk of developing lung cancer is to quit smoking. We discussed whether or not he is ready to commit to setting a quit date. He is currently not ready to set a quit date. We discussed options for tools to aid in quitting smoking including nicotine replacement therapy, non-nicotine medications, support groups, Quit Smart classes, and behavior modification. We discussed that often times setting smaller, more achievable goals, such as eliminating 1 cigarette a day for a week and then 2 cigarettes a day for a week can be helpful in slowly decreasing the number of cigarettes smoked. This allows for a sense of accomplishment as well as providing a clinical benefit. I gave him the " Be Stronger Than Your Excuses" card with contact information for community resources, classes, free nicotine replacement therapy, and access to mobile apps, text messaging, and on-line smoking cessation help. I have also given Darin Hunter my card and contact information in the event he needs to contact me. We discussed the time and location of the scan, and that either Darin Hunter, CMA, or I will call with the results within 24-48 hours of receiving them. I have provided Darin Hunter with a copy of the power point we  viewed  as a resource in the event they need reinforcement of the concepts we discussed today in the office. The patient verbalized understanding of all of  the  above and had no further questions upon leaving the office. They have my contact information in the event they have any further questions.    Magdalen Spatz, NP  03/26/2016

## 2016-04-01 ENCOUNTER — Telehealth: Payer: Self-pay | Admitting: Acute Care

## 2016-04-01 ENCOUNTER — Other Ambulatory Visit: Payer: Self-pay | Admitting: Acute Care

## 2016-04-01 DIAGNOSIS — F17210 Nicotine dependence, cigarettes, uncomplicated: Secondary | ICD-10-CM

## 2016-04-01 NOTE — Telephone Encounter (Signed)
These results were called to the patient who verbalized understanding. I explained that  Lung RADS 2: indicates nodules that are benign in appearance and behavior with a very low likelihood of becoming a clinically active cancer due to size or lack of growth. Recommendation per radiology is for a repeat LDCT in 12 months. I told the patient that we will call and schedule the scanning for April of 2018. He verbalized understanding of the above and had no further questions at the conclusion of the call.

## 2016-06-13 ENCOUNTER — Other Ambulatory Visit: Payer: Self-pay | Admitting: Physician Assistant

## 2016-06-25 ENCOUNTER — Ambulatory Visit (INDEPENDENT_AMBULATORY_CARE_PROVIDER_SITE_OTHER): Payer: BLUE CROSS/BLUE SHIELD | Admitting: Family Medicine

## 2016-06-25 ENCOUNTER — Encounter: Payer: Self-pay | Admitting: Family Medicine

## 2016-06-25 VITALS — BP 138/76 | HR 90 | Temp 97.7°F | Ht 76.0 in | Wt 213.7 lb

## 2016-06-25 DIAGNOSIS — M791 Myalgia, unspecified site: Secondary | ICD-10-CM

## 2016-06-25 DIAGNOSIS — L570 Actinic keratosis: Secondary | ICD-10-CM

## 2016-06-25 DIAGNOSIS — I10 Essential (primary) hypertension: Secondary | ICD-10-CM | POA: Diagnosis not present

## 2016-06-25 DIAGNOSIS — R739 Hyperglycemia, unspecified: Secondary | ICD-10-CM | POA: Diagnosis not present

## 2016-06-25 DIAGNOSIS — I779 Disorder of arteries and arterioles, unspecified: Secondary | ICD-10-CM | POA: Diagnosis not present

## 2016-06-25 DIAGNOSIS — M159 Polyosteoarthritis, unspecified: Secondary | ICD-10-CM

## 2016-06-25 DIAGNOSIS — I700 Atherosclerosis of aorta: Secondary | ICD-10-CM

## 2016-06-25 DIAGNOSIS — R300 Dysuria: Secondary | ICD-10-CM | POA: Diagnosis not present

## 2016-06-25 DIAGNOSIS — I739 Peripheral vascular disease, unspecified: Secondary | ICD-10-CM

## 2016-06-25 DIAGNOSIS — F17200 Nicotine dependence, unspecified, uncomplicated: Secondary | ICD-10-CM

## 2016-06-25 LAB — CBC WITH DIFFERENTIAL/PLATELET
BASOS ABS: 0 10*3/uL (ref 0.0–0.1)
BASOS PCT: 0.5 % (ref 0.0–3.0)
Eosinophils Absolute: 0.5 10*3/uL (ref 0.0–0.7)
Eosinophils Relative: 5.1 % — ABNORMAL HIGH (ref 0.0–5.0)
HEMATOCRIT: 44 % (ref 39.0–52.0)
HEMOGLOBIN: 14.9 g/dL (ref 13.0–17.0)
LYMPHS PCT: 19.7 % (ref 12.0–46.0)
Lymphs Abs: 2 10*3/uL (ref 0.7–4.0)
MCHC: 33.9 g/dL (ref 30.0–36.0)
MCV: 98 fl (ref 78.0–100.0)
MONOS PCT: 9.3 % (ref 3.0–12.0)
Monocytes Absolute: 0.9 10*3/uL (ref 0.1–1.0)
NEUTROS ABS: 6.7 10*3/uL (ref 1.4–7.7)
Neutrophils Relative %: 65.4 % (ref 43.0–77.0)
PLATELETS: 224 10*3/uL (ref 150.0–400.0)
RBC: 4.49 Mil/uL (ref 4.22–5.81)
RDW: 13.3 % (ref 11.5–15.5)
WBC: 10.2 10*3/uL (ref 4.0–10.5)

## 2016-06-25 LAB — POCT URINALYSIS DIPSTICK
BILIRUBIN UA: NEGATIVE
Blood, UA: NEGATIVE
GLUCOSE UA: NEGATIVE
KETONES UA: NEGATIVE
LEUKOCYTES UA: NEGATIVE
Nitrite, UA: NEGATIVE
PH UA: 6
Protein, UA: NEGATIVE
Spec Grav, UA: >=1.03
Urobilinogen, UA: 0.2

## 2016-06-25 LAB — BASIC METABOLIC PANEL
BUN: 15 mg/dL (ref 6–23)
CHLORIDE: 104 meq/L (ref 96–112)
CO2: 31 mEq/L (ref 19–32)
CREATININE: 0.9 mg/dL (ref 0.40–1.50)
Calcium: 10 mg/dL (ref 8.4–10.5)
GFR: 92.11 mL/min (ref >60.00)
GLUCOSE: 106 mg/dL — AB (ref 70–99)
Potassium: 4.4 mEq/L (ref 3.5–5.1)
Sodium: 142 mEq/L (ref 135–145)

## 2016-06-25 LAB — HEMOGLOBIN A1C: HEMOGLOBIN A1C: 5.5 % (ref 4.6–6.5)

## 2016-06-25 NOTE — Progress Notes (Signed)
Pre visit review using our clinic review tool, if applicable. No additional management support is needed unless otherwise documented below in the visit note. 

## 2016-06-25 NOTE — Patient Instructions (Addendum)
Before you leave: -labs -neck spasm exercises -schedule follow up in 1 month  Please quit smoking  We recommend the following healthy lifestyle measures: - eat a healthy whole foods diet consisting of regular small meals composed of vegetables, fruits, beans, nuts, seeds, healthy meats such as white chicken and fish and whole grains.  - avoid sweets, white starchy foods, fried foods, fast food, processed foods, sodas, red meet and other fattening foods.  - get a least 150-300 minutes of aerobic exercise per week.   We have ordered labs or studies at this visit. It can take up to 1-2 weeks for results and processing. IF results require follow up or explanation, we will call you with instructions. Clinically stable results will be released to your [facility]. If you have not heard from [place] or cannot find your results in [facility] in 2 weeks please contact our office at [phone].  If you are not yet signed up for [facility], please consider signing up.
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
## 2016-07-23 ENCOUNTER — Encounter: Payer: Self-pay | Admitting: Family Medicine

## 2016-07-23 ENCOUNTER — Ambulatory Visit (INDEPENDENT_AMBULATORY_CARE_PROVIDER_SITE_OTHER): Payer: BLUE CROSS/BLUE SHIELD | Admitting: Family Medicine

## 2016-07-23 VITALS — BP 122/80 | HR 73 | Temp 98.3°F | Ht 76.0 in | Wt 207.0 lb

## 2016-07-23 DIAGNOSIS — H16139 Photokeratitis, unspecified eye: Secondary | ICD-10-CM

## 2016-07-23 DIAGNOSIS — M2550 Pain in unspecified joint: Secondary | ICD-10-CM | POA: Diagnosis not present

## 2016-07-23 DIAGNOSIS — B354 Tinea corporis: Secondary | ICD-10-CM

## 2016-07-23 DIAGNOSIS — M542 Cervicalgia: Secondary | ICD-10-CM

## 2016-07-23 DIAGNOSIS — R300 Dysuria: Secondary | ICD-10-CM

## 2016-07-23 DIAGNOSIS — L570 Actinic keratosis: Secondary | ICD-10-CM

## 2016-07-23 NOTE — Progress Notes (Signed)
Pre visit review using our clinic review tool, if applicable. No additional management support is needed unless otherwise documented below in the visit note. 
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
## 2016-07-23 NOTE — Patient Instructions (Addendum)
BEFORE YOU LEAVE: -follow up: 3 months  Use over the counter athlete fungal foot cream such as Lotrimin (clotrimazole) topical twice daily to the skin rash on the chest for 1 month. Follow up in 1 month if not completely resolved.
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
## 2016-10-24 ENCOUNTER — Telehealth: Payer: Self-pay

## 2016-10-24 ENCOUNTER — Ambulatory Visit (INDEPENDENT_AMBULATORY_CARE_PROVIDER_SITE_OTHER)
Admission: RE | Admit: 2016-10-24 | Discharge: 2016-10-24 | Disposition: A | Discharge status: Home / Self Care | Payer: BLUE CROSS/BLUE SHIELD | Source: Ambulatory Visit | Attending: Family Medicine | Admitting: Family Medicine

## 2016-10-24 ENCOUNTER — Ambulatory Visit (INDEPENDENT_AMBULATORY_CARE_PROVIDER_SITE_OTHER): Payer: BLUE CROSS/BLUE SHIELD | Admitting: Family Medicine

## 2016-10-24 ENCOUNTER — Encounter: Payer: Self-pay | Admitting: Family Medicine

## 2016-10-24 VITALS — BP 138/70 | HR 88 | Temp 97.9°F | Ht 76.0 in | Wt 198.1 lb

## 2016-10-24 DIAGNOSIS — Z23 Encounter for immunization: Secondary | ICD-10-CM | POA: Diagnosis not present

## 2016-10-24 DIAGNOSIS — I10 Essential (primary) hypertension: Secondary | ICD-10-CM | POA: Diagnosis not present

## 2016-10-24 DIAGNOSIS — R899 Unspecified abnormal finding in specimens from other organs, systems and tissues: Secondary | ICD-10-CM

## 2016-10-24 DIAGNOSIS — R1031 Right lower quadrant pain: Secondary | ICD-10-CM

## 2016-10-24 DIAGNOSIS — R4789 Other speech disturbances: Secondary | ICD-10-CM | POA: Diagnosis not present

## 2016-10-24 DIAGNOSIS — R29898 Other symptoms and signs involving the musculoskeletal system: Secondary | ICD-10-CM | POA: Diagnosis not present

## 2016-10-24 DIAGNOSIS — M795 Residual foreign body in soft tissue: Secondary | ICD-10-CM

## 2016-10-24 DIAGNOSIS — F17200 Nicotine dependence, unspecified, uncomplicated: Secondary | ICD-10-CM

## 2016-10-24 DIAGNOSIS — I739 Peripheral vascular disease, unspecified: Secondary | ICD-10-CM

## 2016-10-24 DIAGNOSIS — I779 Disorder of arteries and arterioles, unspecified: Secondary | ICD-10-CM

## 2016-10-24 LAB — CBC WITH DIFFERENTIAL/PLATELET
BASOS PCT: 0.5 % (ref 0.0–3.0)
Basophils Absolute: 0.1 10*3/uL (ref 0.0–0.1)
EOS ABS: 0.3 10*3/uL (ref 0.0–0.7)
Eosinophils Relative: 2.4 % (ref 0.0–5.0)
HEMATOCRIT: 42.2 % (ref 39.0–52.0)
Hemoglobin: 14.6 g/dL (ref 13.0–17.0)
LYMPHS ABS: 1.7 10*3/uL (ref 0.7–4.0)
LYMPHS PCT: 15 % (ref 12.0–46.0)
MCHC: 34.5 g/dL (ref 30.0–36.0)
MCV: 98.1 fl (ref 78.0–100.0)
MONOS PCT: 7.8 % (ref 3.0–12.0)
Monocytes Absolute: 0.9 10*3/uL (ref 0.1–1.0)
NEUTROS ABS: 8.2 10*3/uL — AB (ref 1.4–7.7)
NEUTROS PCT: 74.3 % (ref 43.0–77.0)
PLATELETS: 221 10*3/uL (ref 150.0–400.0)
RBC: 4.3 Mil/uL (ref 4.22–5.81)
RDW: 12.8 % (ref 11.5–15.5)
WBC: 11 10*3/uL — ABNORMAL HIGH (ref 4.0–10.5)

## 2016-10-24 LAB — COMPREHENSIVE METABOLIC PANEL
ALT: 14 U/L (ref 0–53)
AST: 16 U/L (ref 0–37)
Albumin: 4.5 g/dL (ref 3.5–5.2)
Alkaline Phosphatase: 48 U/L (ref 39–117)
BUN: 18 mg/dL (ref 6–23)
CALCIUM: 9.7 mg/dL (ref 8.4–10.5)
CHLORIDE: 103 meq/L (ref 96–112)
CO2: 28 meq/L (ref 19–32)
Creatinine, Ser: 0.85 mg/dL (ref 0.40–1.50)
GFR: 98.27 mL/min (ref >60.00)
Glucose, Bld: 106 mg/dL — ABNORMAL HIGH (ref 70–99)
Potassium: 3.9 mEq/L (ref 3.5–5.1)
Sodium: 140 mEq/L (ref 135–145)
Total Bilirubin: 0.8 mg/dL (ref 0.2–1.2)
Total Protein: 7.4 g/dL (ref 6.0–8.3)

## 2016-10-24 MED ORDER — IOPAMIDOL (ISOVUE-300) INJECTION 61%
100.0000 mL | Freq: Once | INTRAVENOUS | Status: AC | PRN
  Administered 2016-10-24: 100 mL via INTRAVENOUS

## 2016-10-24 NOTE — Patient Instructions (Addendum)
BEFORE YOU LEAVE: -labs -stat CT request Flu shot -follow up: 3 months and pending results  -We placed a referral for you as discussed to the neurologist regarding the leg and speech issues. We also will be getting the CT scan of the head as we discussed. Please call us if you have not heard from [place] regarding these appointments in the next several days.  -We have ordered labs, and CT scansat this visit. It can take up to 1-2 weeks for results and processing. IF results require follow up or explanation, we will call you with instructions. Clinically stable results will be released to your [facility]. If you have not heard from [place] or cannot find your results in [facility] in 2 weeks please contact our office at [phone].  If you are not yet signed up for [facility], please consider signing up.  Seek care immediatly if symptoms worsen or new concerns arise.

## 2016-10-24 NOTE — Progress Notes (Signed)
Pre visit review using our clinic review tool, if applicable. No additional management support is needed unless otherwise documented below in the visit note. 

## 2016-10-24 NOTE — Telephone Encounter (Signed)
Rec'd call from CT with results of the STAT CT on  Darin Hunter, Darin Hunter; [birth year] at approx 1:30   Reported chronic changes without acute abnormality. Confirmed no acute or or new issues;  Report given to Dr. Maudie Mercury verbally.  This is for documentation purposes;

## 2016-10-28 ENCOUNTER — Encounter: Payer: Self-pay | Admitting: Neurology

## 2016-10-28 ENCOUNTER — Ambulatory Visit (INDEPENDENT_AMBULATORY_CARE_PROVIDER_SITE_OTHER): Payer: BLUE CROSS/BLUE SHIELD | Admitting: Neurology

## 2016-10-28 VITALS — BP 146/74 | HR 99 | Ht 76.0 in | Wt 202.0 lb

## 2016-10-28 DIAGNOSIS — I779 Disorder of arteries and arterioles, unspecified: Secondary | ICD-10-CM | POA: Diagnosis not present

## 2016-10-28 DIAGNOSIS — I10 Essential (primary) hypertension: Secondary | ICD-10-CM

## 2016-10-28 DIAGNOSIS — I739 Peripheral vascular disease, unspecified: Secondary | ICD-10-CM

## 2016-10-28 DIAGNOSIS — F17200 Nicotine dependence, unspecified, uncomplicated: Secondary | ICD-10-CM

## 2016-10-28 DIAGNOSIS — I639 Cerebral infarction, unspecified: Secondary | ICD-10-CM | POA: Diagnosis not present

## 2016-10-28 MED ORDER — CLOPIDOGREL BISULFATE 75 MG PO TABS: 75.0000 mg | ORAL_TABLET | Freq: Every day | ORAL | 5 refills | Status: DC

## 2016-10-28 NOTE — Patient Instructions (Addendum)
1.  Stop aspirin.  Instead, you will start clopidogrel (Plavix) 75mg  daily to help prevent strokes. 2.  We will start a cholesterol-lowering medication.  I want to check a cholesterol level first.  I want to check hepatic panel as well. 3.  Try to quit smoking 4.  Blood pressure contro 5.  Will check a 2D echocardiogram - Scheduled for 10/30/16 @ 3:45 p.m. @ [facility] [place],  [place]  [id number].  Main: [phone]  6. Will check a carotid doppler. This will be done at [facility] Cower [place], [place] [address] ph: [phone] fax: [phone]) They will contact you to schedule or you may call them at number provided.  7.  Follow up in 6 months.

## 2016-10-28 NOTE — Addendum Note (Signed)
Addended by: Agnes Lawrence on: 10/28/2016 05:03 PM   Modules accepted: Orders
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
## 2016-10-29 ENCOUNTER — Telehealth: Payer: Self-pay

## 2016-10-29 ENCOUNTER — Other Ambulatory Visit (INDEPENDENT_AMBULATORY_CARE_PROVIDER_SITE_OTHER): Payer: BLUE CROSS/BLUE SHIELD

## 2016-10-29 DIAGNOSIS — I779 Disorder of arteries and arterioles, unspecified: Secondary | ICD-10-CM

## 2016-10-29 DIAGNOSIS — I739 Peripheral vascular disease, unspecified: Secondary | ICD-10-CM

## 2016-10-29 DIAGNOSIS — I639 Cerebral infarction, unspecified: Secondary | ICD-10-CM

## 2016-10-29 DIAGNOSIS — R899 Unspecified abnormal finding in specimens from other organs, systems and tissues: Secondary | ICD-10-CM

## 2016-10-29 LAB — LIPID PANEL
CHOL/HDL RATIO: 4
Cholesterol: 152 mg/dL (ref 0–200)
HDL: 41.7 mg/dL (ref >39.00)
LDL Cholesterol: 82 mg/dL (ref 0–99)
NonHDL: 110.15
TRIGLYCERIDES: 143 mg/dL (ref 0.0–149.0)
VLDL: 28.6 mg/dL (ref 0.0–40.0)

## 2016-10-29 MED ORDER — SIMVASTATIN 10 MG PO TABS: 10.0000 mg | ORAL_TABLET | Freq: Every day | ORAL | 3 refills | Status: DC

## 2016-10-29 NOTE — Telephone Encounter (Signed)
Message relayed to wife. Will have pt return call.

## 2016-10-29 NOTE — Telephone Encounter (Signed)
-----   Message from Pieter Partridge, DO sent at 10/29/2016  2:38 PM EDT ----- Overall, cholesterol looks okay.  I would like to add a small dose of cholesterol-lowering medicaton (simvastatin 10mg  daily).  Repeat fasting lipid panel prior to follow up.

## 2016-10-30 ENCOUNTER — Ambulatory Visit (HOSPITAL_COMMUNITY): Payer: BLUE CROSS/BLUE SHIELD | Attending: Cardiovascular Disease

## 2016-10-30 ENCOUNTER — Other Ambulatory Visit: Payer: Self-pay

## 2016-10-30 DIAGNOSIS — I639 Cerebral infarction, unspecified: Secondary | ICD-10-CM | POA: Diagnosis not present

## 2016-10-30 DIAGNOSIS — I5030 Unspecified diastolic (congestive) heart failure: Secondary | ICD-10-CM | POA: Diagnosis not present

## 2016-10-30 DIAGNOSIS — I739 Peripheral vascular disease, unspecified: Secondary | ICD-10-CM

## 2016-10-30 DIAGNOSIS — I779 Disorder of arteries and arterioles, unspecified: Secondary | ICD-10-CM | POA: Diagnosis not present

## 2016-10-30 NOTE — Telephone Encounter (Signed)
Dr. Maudie Mercury; Please sign and close Information provided by VO and phone note Sh

## 2016-10-31 ENCOUNTER — Telehealth: Payer: Self-pay

## 2016-10-31 NOTE — Telephone Encounter (Signed)
Results relayed to pt. Reply was, "So did I have a stroke? prior?I am confused as to what is wrong with me." Please advise.

## 2016-10-31 NOTE — Telephone Encounter (Signed)
-----   Message from Pieter Partridge, DO sent at 10/31/2016  7:16 AM EDT ----- Echo looks okay

## 2016-10-31 NOTE — Telephone Encounter (Signed)
Mychart message sent. Will call pt later if no response.

## 2016-10-31 NOTE — Telephone Encounter (Signed)
See my chart encounter.

## 2016-11-01 ENCOUNTER — Ambulatory Visit
Admission: RE | Admit: 2016-11-01 | Discharge: 2016-11-01 | Disposition: A | Discharge status: Home / Self Care | Payer: BLUE CROSS/BLUE SHIELD | Source: Ambulatory Visit | Attending: Neurology | Admitting: Neurology

## 2016-11-01 DIAGNOSIS — I739 Peripheral vascular disease, unspecified: Secondary | ICD-10-CM

## 2016-11-01 DIAGNOSIS — I639 Cerebral infarction, unspecified: Secondary | ICD-10-CM

## 2016-11-01 DIAGNOSIS — I779 Disorder of arteries and arterioles, unspecified: Secondary | ICD-10-CM

## 2016-11-04 ENCOUNTER — Telehealth: Payer: Self-pay

## 2016-11-04 ENCOUNTER — Encounter: Payer: Self-pay | Admitting: Family Medicine

## 2016-11-04 NOTE — Telephone Encounter (Signed)
I called the pts wife and scheduled a doctor's appt for 11/13 at 9:15am and I informed her I cancelled the lab appt as he can go to the lab after the visit with Dr Maudie Mercury.

## 2016-11-04 NOTE — Telephone Encounter (Signed)
Sent via mychart

## 2016-11-04 NOTE — Telephone Encounter (Signed)
-----   Message from Pieter Partridge, DO sent at 11/03/2016  5:26 PM EST ----- Carotid Doppler looks ok

## 2016-11-10 DIAGNOSIS — R739 Hyperglycemia, unspecified: Secondary | ICD-10-CM | POA: Insufficient documentation

## 2016-11-10 DIAGNOSIS — I700 Atherosclerosis of aorta: Secondary | ICD-10-CM | POA: Insufficient documentation

## 2016-11-10 DIAGNOSIS — I639 Cerebral infarction, unspecified: Secondary | ICD-10-CM | POA: Insufficient documentation
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
## 2016-11-11 ENCOUNTER — Ambulatory Visit (INDEPENDENT_AMBULATORY_CARE_PROVIDER_SITE_OTHER): Payer: BLUE CROSS/BLUE SHIELD | Admitting: Family Medicine

## 2016-11-11 ENCOUNTER — Other Ambulatory Visit: Payer: BLUE CROSS/BLUE SHIELD

## 2016-11-11 ENCOUNTER — Encounter: Payer: Self-pay | Admitting: Family Medicine

## 2016-11-11 VITALS — BP 122/70 | HR 87 | Temp 98.2°F | Ht 76.0 in | Wt 198.7 lb

## 2016-11-11 DIAGNOSIS — I700 Atherosclerosis of aorta: Secondary | ICD-10-CM | POA: Diagnosis not present

## 2016-11-11 DIAGNOSIS — R739 Hyperglycemia, unspecified: Secondary | ICD-10-CM | POA: Diagnosis not present

## 2016-11-11 DIAGNOSIS — F1010 Alcohol abuse, uncomplicated: Secondary | ICD-10-CM

## 2016-11-11 DIAGNOSIS — I639 Cerebral infarction, unspecified: Secondary | ICD-10-CM

## 2016-11-11 DIAGNOSIS — R251 Tremor, unspecified: Secondary | ICD-10-CM | POA: Diagnosis not present

## 2016-11-11 DIAGNOSIS — K219 Gastro-esophageal reflux disease without esophagitis: Secondary | ICD-10-CM

## 2016-11-11 DIAGNOSIS — M2550 Pain in unspecified joint: Secondary | ICD-10-CM

## 2016-11-11 LAB — CBC WITH DIFFERENTIAL/PLATELET
BASOS PCT: 0.4 % (ref 0.0–3.0)
Basophils Absolute: 0 10*3/uL (ref 0.0–0.1)
EOS PCT: 2.3 % (ref 0.0–5.0)
Eosinophils Absolute: 0.2 10*3/uL (ref 0.0–0.7)
HEMATOCRIT: 43 % (ref 39.0–52.0)
HEMOGLOBIN: 14.9 g/dL (ref 13.0–17.0)
Lymphocytes Relative: 19.9 % (ref 12.0–46.0)
Lymphs Abs: 1.8 10*3/uL (ref 0.7–4.0)
MCHC: 34.7 g/dL (ref 30.0–36.0)
MCV: 97.7 fl (ref 78.0–100.0)
MONO ABS: 0.6 10*3/uL (ref 0.1–1.0)
Monocytes Relative: 6.4 % (ref 3.0–12.0)
Neutro Abs: 6.2 10*3/uL (ref 1.4–7.7)
Neutrophils Relative %: 71 % (ref 43.0–77.0)
Platelets: 216 10*3/uL (ref 150.0–400.0)
RBC: 4.4 Mil/uL (ref 4.22–5.81)
RDW: 12.7 % (ref 11.5–15.5)
WBC: 8.8 10*3/uL (ref 4.0–10.5)

## 2016-11-11 LAB — TSH: TSH: 1.2 u[IU]/mL (ref 0.35–4.50)

## 2016-11-11 NOTE — Progress Notes (Signed)
Pre visit review using our clinic review tool, if applicable. No additional management support is needed unless otherwise documented below in the visit note. 

## 2016-11-11 NOTE — Patient Instructions (Addendum)
BEFORE YOU LEAVE: -labs -follow up: 3 months  If any further stomach issues please see your gastroenterologist.  Please let your neurologist know about your alcohol patterns.  You must cut back on the alcohol use and quit. Please see the resources provided and go to the hospital if any trouble stopping.  If you want to see the rheumatologist please let me know and I will place a referral.  We have ordered labs or studies at this visit. It can take up to 1-2 weeks for results and processing. IF results require follow up or explanation, we will call you with instructions. Clinically stable results will be released to your [facility]. If you have not heard from [place] or cannot find your results in [facility] in 2 weeks please contact our office at [phone].  If you are not yet signed up for [facility], please consider signing up.

## 2016-11-12 LAB — CYCLIC CITRUL PEPTIDE ANTIBODY, IGG

## 2016-11-12 LAB — RHEUMATOID FACTOR

## 2016-12-07 ENCOUNTER — Other Ambulatory Visit: Payer: Self-pay | Admitting: Family Medicine

## 2017-02-10 ENCOUNTER — Ambulatory Visit: Payer: BLUE CROSS/BLUE SHIELD | Admitting: Family Medicine

## 2017-02-18 ENCOUNTER — Other Ambulatory Visit: Payer: Self-pay | Admitting: Family Medicine

## 2017-03-28 ENCOUNTER — Ambulatory Visit (INDEPENDENT_AMBULATORY_CARE_PROVIDER_SITE_OTHER)
Admission: RE | Admit: 2017-03-28 | Discharge: 2017-03-28 | Disposition: A | Discharge status: Home / Self Care | Payer: BLUE CROSS/BLUE SHIELD | Source: Ambulatory Visit | Attending: Acute Care | Admitting: Acute Care

## 2017-03-28 DIAGNOSIS — Z87891 Personal history of nicotine dependence: Secondary | ICD-10-CM

## 2017-03-28 DIAGNOSIS — F17210 Nicotine dependence, cigarettes, uncomplicated: Secondary | ICD-10-CM

## 2017-04-01 ENCOUNTER — Telehealth: Payer: Self-pay | Admitting: Acute Care

## 2017-04-01 DIAGNOSIS — F17210 Nicotine dependence, cigarettes, uncomplicated: Secondary | ICD-10-CM

## 2017-04-01 NOTE — Telephone Encounter (Signed)
Please call patient let him know that his low-dose CT screening scan was read as a Lung RADS 2: nodules that are benign in appearance and behavior with a very low likelihood of becoming a clinically active cancer due to size or lack of growth. Recommendation per radiology is for a repeat LDCT in 12 months. Let him know we will order and schedule the scan for April 2019. Additionally please let him know that there was a finding of Aortic atherosclerosis and multi vessel coronary artery Calcification, as there was in previous screenings. He is currently being treated with a statin medication. Please have him follow up with his PCP regarding clinical management of this finding as his primary care provider knows his health history well. Please order annual screening for April 2019, and fax results report to primary care provider. Thank you so much

## 2017-04-02 NOTE — Telephone Encounter (Signed)
Spoke with pt's wife and informed of pt's ct results per Eric Form, NP.  She verbalized understanding.  Copy sent to Dr Maudie Mercury and Dr Kathreen Devoid, pt's new PCP.  Order placed for 1 yr f/u low dose ct.

## 2017-04-03 ENCOUNTER — Encounter: Payer: Self-pay | Admitting: Family Medicine

## 2017-04-03 DIAGNOSIS — J439 Emphysema, unspecified: Secondary | ICD-10-CM | POA: Insufficient documentation

## 2017-04-28 ENCOUNTER — Ambulatory Visit: Payer: BLUE CROSS/BLUE SHIELD | Admitting: Neurology

## 2017-09-18 ENCOUNTER — Encounter: Payer: Self-pay | Admitting: Family Medicine

## 2017-10-08 ENCOUNTER — Encounter: Payer: Self-pay | Admitting: Internal Medicine

## 2017-12-01 ENCOUNTER — Other Ambulatory Visit: Payer: Self-pay

## 2017-12-01 ENCOUNTER — Ambulatory Visit (AMBULATORY_SURGERY_CENTER): Payer: Self-pay | Admitting: *Deleted

## 2017-12-01 VITALS — Ht 76.0 in | Wt 212.0 lb

## 2017-12-01 DIAGNOSIS — Z800 Family history of malignant neoplasm of digestive organs: Secondary | ICD-10-CM

## 2017-12-01 DIAGNOSIS — Z86010 Personal history of colonic polyps: Secondary | ICD-10-CM

## 2017-12-01 MED ORDER — NA SULFATE-K SULFATE-MG SULF 17.5-3.13-1.6 GM/177ML PO SOLN: 1.0000 | Freq: Once | ORAL | 0 refills | Status: AC

## 2017-12-01 NOTE — Progress Notes (Signed)
No egg or soy allergy known to patient  No issues with past sedation with any surgeries  or procedures, no intubation problems  No diet pills per patient No home 02 use per patient  No blood thinners per patient  Pt denies issues with constipation  No A fib or A flutter  EMMI video sent to pt's e mail --pt declined $15 coupon to pt for suprep today

## 2017-12-15 ENCOUNTER — Other Ambulatory Visit: Payer: Self-pay

## 2017-12-15 ENCOUNTER — Ambulatory Visit (AMBULATORY_SURGERY_CENTER): Payer: BLUE CROSS/BLUE SHIELD | Admitting: Internal Medicine

## 2017-12-15 ENCOUNTER — Encounter: Payer: Self-pay | Admitting: Internal Medicine

## 2017-12-15 VITALS — BP 156/90 | HR 77 | Temp 98.2°F | Resp 15 | Ht 76.0 in | Wt 212.0 lb

## 2017-12-15 DIAGNOSIS — Z86010 Personal history of colonic polyps: Secondary | ICD-10-CM

## 2017-12-15 DIAGNOSIS — D124 Benign neoplasm of descending colon: Secondary | ICD-10-CM

## 2017-12-15 DIAGNOSIS — D123 Benign neoplasm of transverse colon: Secondary | ICD-10-CM

## 2017-12-15 DIAGNOSIS — D120 Benign neoplasm of cecum: Secondary | ICD-10-CM

## 2017-12-15 DIAGNOSIS — D125 Benign neoplasm of sigmoid colon: Secondary | ICD-10-CM

## 2017-12-15 MED ORDER — SODIUM CHLORIDE 0.9 % IV SOLN: 500.0000 mL | Freq: Once | INTRAVENOUS | Status: DC

## 2017-12-15 NOTE — Patient Instructions (Signed)
YOU HAD AN ENDOSCOPIC PROCEDURE TODAY AT THE [facility]:   Refer to the procedure report that was given to you for any specific questions about what was found during the examination.  If the procedure report does not answer your questions, please call your gastroenterologist to clarify.  If you requested that your care partner not be given the details of your procedure findings, then the procedure report has been included in a sealed envelope for you to review at your convenience later.  YOU SHOULD EXPECT: Some feelings of bloating in the abdomen. Passage of more gas than usual.  Walking can help get rid of the air that was put into your GI tract during the procedure and reduce the bloating. If you had a lower endoscopy (such as a colonoscopy or flexible sigmoidoscopy) you may notice spotting of blood in your stool or on the toilet paper. If you underwent a bowel prep for your procedure, you may not have a normal bowel movement for a few days.  Please Note:  You might notice some irritation and congestion in your nose or some drainage.  This is from the oxygen used during your procedure.  There is no need for concern and it should clear up in a day or so.  SYMPTOMS TO REPORT IMMEDIATELY:   Following lower endoscopy (colonoscopy or flexible sigmoidoscopy):  Excessive amounts of blood in the stool  Significant tenderness or worsening of abdominal pains  Swelling of the abdomen that is new, acute  Fever of 100F or higher    For urgent or emergent issues, a gastroenterologist can be reached at any hour by calling [phone].   DIET:  We do recommend a small meal at first, but then you may proceed to your regular diet.  Drink plenty of fluids but you should avoid alcoholic beverages for 24 hours.  ACTIVITY:  You should plan to take it easy for the rest of today and you should NOT DRIVE or use heavy machinery until tomorrow (because of the sedation medicines used during the test).     FOLLOW UP: Our staff will call the number listed on your records the next business day following your procedure to check on you and address any questions or concerns that you may have regarding the information given to you following your procedure. If we do not reach you, we will leave a message.  However, if you are feeling well and you are not experiencing any problems, there is no need to return our call.  We will assume that you have returned to your regular daily activities without incident.  If any biopsies were taken you will be contacted by phone or by letter within the next 1-3 weeks.  Please call us at [phone] if you have not heard about the biopsies in 3 weeks.    SIGNATURES/CONFIDENTIALITY: You and/or your care partner have signed paperwork which will be entered into your electronic medical record.  These signatures attest to the fact that that the information above on your After Visit Summary has been reviewed and is understood.  Full responsibility of the confidentiality of this discharge information lies with you and/or your care-partner.   Resume medications. Information given on polyps and hemorrhoids. 

## 2017-12-15 NOTE — Progress Notes (Signed)
To Pacu, VSS. Report to [email or web address] 

## 2017-12-15 NOTE — Progress Notes (Signed)
Called to room to assist during endoscopic procedure.  Patient ID and intended procedure confirmed with present staff. Received instructions for my participation in the procedure from the performing physician.  

## 2017-12-15 NOTE — Progress Notes (Signed)
Pt's states no medical or surgical changes since previsit or office visit. 

## 2017-12-15 NOTE — Op Note (Signed)
[place] Patient Name: Darin Hunter Procedure Date: 12/15/2017 8:01 AM MRN: [id number] Endoscopist: Docia Chuck. Henrene Pastor , MD Age: 61 Referring MD:  Date of Birth: 1956/11/05 Gender: Male Account #: [id number] Procedure:                Colonoscopy, with cold snare polypectomy x 4 Indications:              High risk colon cancer surveillance: Personal                            history of multiple (3 or more) adenomas. Previous                            examinations 2005, 2008, 2013. Mother with colon                            cancer in her 37s Medicines:                Monitored Anesthesia Care Procedure:                Pre-Anesthesia Assessment:                           - Prior to the procedure, a History and Physical                            was performed, and patient medications and                            allergies were reviewed. The patient's tolerance of                            previous anesthesia was also reviewed. The risks                            and benefits of the procedure and the sedation                            options and risks were discussed with the patient.                            All questions were answered, and informed consent                            was obtained. Prior Anticoagulants: The patient has                            taken no previous anticoagulant or antiplatelet                            agents. ASA Grade Assessment: II - A patient with                            mild systemic disease. After reviewing the risks  and benefits, the patient was deemed in                            satisfactory condition to undergo the procedure.                           After obtaining informed consent, the colonoscope                            was passed under direct vision. Throughout the                            procedure, the patient's blood pressure, pulse, and                            oxygen saturations  were monitored continuously. The                            Colonoscope was introduced through the anus and                            advanced to the the cecum, identified by                            appendiceal orifice and ileocecal valve. The                            ileocecal valve, appendiceal orifice, and rectum                            were photographed. The quality of the bowel                            preparation was excellent. The colonoscopy was                            performed without difficulty. The patient tolerated                            the procedure well. The bowel preparation used was                            SUPREP. Scope In: 8:16:49 AM Scope Out: 8:34:15 AM Scope Withdrawal Time: 0 hours 13 minutes 57 seconds  Total Procedure Duration: 0 hours 17 minutes 26 seconds  Findings:                 Four polyps were found in the sigmoid colon,                            descending colon, transverse colon and cecum. The                            polyps were 3 to 5 mm in size. These polyps were  removed with a cold snare. Resection and retrieval                            were complete.                           Internal hemorrhoids were found during retroflexion.                           The exam was otherwise without abnormality on                            direct and retroflexion views. Complications:            No immediate complications. Estimated blood loss:                            None. Estimated Blood Loss:     Estimated blood loss: none. Impression:               - Four 3 to 5 mm polyps in the sigmoid colon, in                            the descending colon, in the transverse colon and                            in the cecum, removed with a cold snare. Resected                            and retrieved.                           - Internal hemorrhoids.                           - The examination was otherwise normal on  direct                            and retroflexion views. Recommendation:           - Repeat colonoscopy in 3 years for surveillance.                           - Patient has a contact number available for                            emergencies. The signs and symptoms of potential                            delayed complications were discussed with the                            patient. Return to normal activities tomorrow.                            Written discharge instructions were provided to the  patient.                           - Resume previous diet.                           - Continue present medications.                           - Await pathology results. Docia Chuck. Henrene Pastor, MD 12/15/2017 8:42:31 AM This report has been signed electronically.

## 2017-12-16 ENCOUNTER — Telehealth: Payer: Self-pay

## 2017-12-16 NOTE — Telephone Encounter (Signed)
Number identifier, left a message. 

## 2017-12-16 NOTE — Telephone Encounter (Signed)
  Follow up Call-  Call back number 12/15/2017 06/07/2015  Post procedure Call Back phone  # [phone]  Permission to leave phone message Yes Yes  Some recent data might be hidden     Patient questions:  Do you have a fever, pain , or abdominal swelling? No. Pain Score  0 *  Have you tolerated food without any problems? Yes.    Have you been able to return to your normal activities? Yes.    Do you have any questions about your discharge instructions: Diet   No. Medications  No. Follow up visit  No.  Do you have questions or concerns about your Care? No.  Actions: * If pain score is 4 or above: No action needed, pain <4.  Pt was not available to talk.  I spoke with his wife, Mrs. Charlestine Massed.  No problems noted per pt's wife. maw

## 2017-12-19 ENCOUNTER — Encounter: Payer: Self-pay | Admitting: Internal Medicine

## 2018-04-02 ENCOUNTER — Ambulatory Visit (INDEPENDENT_AMBULATORY_CARE_PROVIDER_SITE_OTHER)
Admission: RE | Admit: 2018-04-02 | Discharge: 2018-04-02 | Disposition: A | Discharge status: Home / Self Care | Payer: BLUE CROSS/BLUE SHIELD | Source: Ambulatory Visit | Attending: Acute Care | Admitting: Acute Care

## 2018-04-02 DIAGNOSIS — F17210 Nicotine dependence, cigarettes, uncomplicated: Secondary | ICD-10-CM | POA: Diagnosis not present

## 2018-04-07 ENCOUNTER — Other Ambulatory Visit: Payer: Self-pay | Admitting: Acute Care

## 2018-04-07 DIAGNOSIS — Z122 Encounter for screening for malignant neoplasm of respiratory organs: Secondary | ICD-10-CM

## 2018-04-07 DIAGNOSIS — F17210 Nicotine dependence, cigarettes, uncomplicated: Secondary | ICD-10-CM

## 2019-02-25 ENCOUNTER — Ambulatory Visit: Payer: BLUE CROSS/BLUE SHIELD | Admitting: Internal Medicine

## 2019-06-04 ENCOUNTER — Other Ambulatory Visit: Payer: Self-pay | Admitting: Acute Care

## 2019-06-04 DIAGNOSIS — Z122 Encounter for screening for malignant neoplasm of respiratory organs: Secondary | ICD-10-CM

## 2019-06-04 DIAGNOSIS — F17210 Nicotine dependence, cigarettes, uncomplicated: Secondary | ICD-10-CM

## 2019-07-12 ENCOUNTER — Ambulatory Visit: Payer: BLUE CROSS/BLUE SHIELD

## 2020-02-08 ENCOUNTER — Ambulatory Visit: Payer: BLUE CROSS/BLUE SHIELD

## 2020-02-28 ENCOUNTER — Other Ambulatory Visit: Payer: Self-pay

## 2020-02-28 ENCOUNTER — Ambulatory Visit (INDEPENDENT_AMBULATORY_CARE_PROVIDER_SITE_OTHER)
Admission: RE | Admit: 2020-02-28 | Discharge: 2020-02-28 | Disposition: A | Discharge status: Home / Self Care | Payer: Commercial Managed Care - PPO | Source: Ambulatory Visit | Attending: Acute Care | Admitting: Acute Care

## 2020-02-28 DIAGNOSIS — F17200 Nicotine dependence, unspecified, uncomplicated: Secondary | ICD-10-CM | POA: Diagnosis not present

## 2020-02-28 DIAGNOSIS — F17210 Nicotine dependence, cigarettes, uncomplicated: Secondary | ICD-10-CM | POA: Diagnosis not present

## 2020-02-28 DIAGNOSIS — Z87891 Personal history of nicotine dependence: Secondary | ICD-10-CM | POA: Diagnosis not present

## 2020-02-28 DIAGNOSIS — Z122 Encounter for screening for malignant neoplasm of respiratory organs: Secondary | ICD-10-CM

## 2020-03-02 NOTE — Progress Notes (Signed)

## 2020-03-03 ENCOUNTER — Other Ambulatory Visit: Payer: Self-pay | Admitting: *Deleted

## 2020-03-03 DIAGNOSIS — F17210 Nicotine dependence, cigarettes, uncomplicated: Secondary | ICD-10-CM

## 2020-03-03 DIAGNOSIS — Z87891 Personal history of nicotine dependence: Secondary | ICD-10-CM

## 2020-04-24 ENCOUNTER — Telehealth: Payer: Self-pay | Admitting: Acute Care

## 2020-04-25 NOTE — Telephone Encounter (Signed)
After talking with 3 different insurance companies. I have to fax all records about the LCS Program to [facility] to try to get them to reconsider the claims for LCS CT don 02/28/2020

## 2020-04-25 NOTE — Telephone Encounter (Signed)
Im not sure what to call and tell the patient... Is there a CPT code that we need to fix on our end so that his insurance will approve it or what should I tell him?

## 2020-04-25 NOTE — Telephone Encounter (Signed)
This was a low dose ct Darin Hunter

## 2020-04-26 NOTE — Telephone Encounter (Signed)
Spoke with patients wife per DPR to let them know we are working on trying to get it covered and that we would let them know something once we knew.

## 2020-04-26 NOTE — Telephone Encounter (Signed)
I have faxed the [facility] and all CT reports to [place] #'s given to me. 35 pages were faxed and I received confirmation that the fax was received. I have spoken with Darin Hunter and explained what I had found out. That I had to fax the records requesting [employer] reconsider denial

## 2020-11-15 ENCOUNTER — Encounter: Payer: Self-pay | Admitting: Internal Medicine

## 2020-12-11 ENCOUNTER — Other Ambulatory Visit: Payer: Self-pay

## 2020-12-11 ENCOUNTER — Ambulatory Visit (AMBULATORY_SURGERY_CENTER): Payer: Self-pay | Admitting: *Deleted

## 2020-12-11 VITALS — Ht 76.0 in | Wt 228.0 lb

## 2020-12-11 DIAGNOSIS — Z86010 Personal history of colonic polyps: Secondary | ICD-10-CM

## 2020-12-11 MED ORDER — SUTAB 1479-225-188 MG PO TABS: 24.0000 | ORAL_TABLET | ORAL | 0 refills | Status: DC

## 2020-12-11 NOTE — Progress Notes (Signed)
No egg or soy allergy known to patient  No issues with past sedation with any surgeries or procedures No intubation problems in the past  No FH of Malignant Hyperthermia No diet pills per patient No home 02 use per patient  No blood thinners per patient  Pt denies issues with constipation  No A fib or A flutter  EMMI video to pt or via [place] 19 guidelines implemented in PV today with Pt and RN  Pt is fully vaccinated  for [employer] given to pt in PV today , Code to Pharmacy   Due to the COVID-19 pandemic we are asking patients to follow certain guidelines.  Pt aware of COVID protocols and [place] guidelines   J andJ vax

## 2020-12-27 ENCOUNTER — Encounter: Payer: Self-pay | Admitting: Internal Medicine

## 2021-01-03 ENCOUNTER — Encounter: Payer: Self-pay | Admitting: Internal Medicine

## 2021-01-03 ENCOUNTER — Ambulatory Visit (AMBULATORY_SURGERY_CENTER): Payer: Commercial Managed Care - PPO | Admitting: Internal Medicine

## 2021-01-03 ENCOUNTER — Other Ambulatory Visit: Payer: Self-pay

## 2021-01-03 VITALS — BP 154/81 | HR 84 | Temp 98.6°F | Resp 17 | Ht 76.0 in | Wt 228.0 lb

## 2021-01-03 DIAGNOSIS — Z86010 Personal history of colonic polyps: Secondary | ICD-10-CM

## 2021-01-03 DIAGNOSIS — K635 Polyp of colon: Secondary | ICD-10-CM | POA: Diagnosis not present

## 2021-01-03 DIAGNOSIS — D125 Benign neoplasm of sigmoid colon: Secondary | ICD-10-CM

## 2021-01-03 MED ORDER — SODIUM CHLORIDE 0.9 % IV SOLN: 500.0000 mL | Freq: Once | INTRAVENOUS | Status: AC

## 2021-01-03 NOTE — Patient Instructions (Signed)
YOU HAD AN ENDOSCOPIC PROCEDURE TODAY AT THE [facility]:   Refer to the procedure report that was given to you for any specific questions about what was found during the examination.  If the procedure report does not answer your questions, please call your gastroenterologist to clarify.  If you requested that your care partner not be given the details of your procedure findings, then the procedure report has been included in a sealed envelope for you to review at your convenience later.  YOU SHOULD EXPECT: Some feelings of bloating in the abdomen. Passage of more gas than usual.  Walking can help get rid of the air that was put into your GI tract during the procedure and reduce the bloating. If you had a lower endoscopy (such as a colonoscopy or flexible sigmoidoscopy) you may notice spotting of blood in your stool or on the toilet paper. If you underwent a bowel prep for your procedure, you may not have a normal bowel movement for a few days.  Please Note:  You might notice some irritation and congestion in your nose or some drainage.  This is from the oxygen used during your procedure.  There is no need for concern and it should clear up in a day or so.  SYMPTOMS TO REPORT IMMEDIATELY:   Following lower endoscopy (colonoscopy or flexible sigmoidoscopy):  Excessive amounts of blood in the stool  Significant tenderness or worsening of abdominal pains  Swelling of the abdomen that is new, acute  Fever of 100F or higher   Black, tarry-looking stools  For urgent or emergent issues, a gastroenterologist can be reached at any hour by calling [phone]. Do not use MyChart messaging for urgent concerns.    DIET:  We do recommend a small meal at first, but then you may proceed to your regular diet.  Drink plenty of fluids but you should avoid alcoholic beverages for 24 hours.  MEDICATIONS: Continue present medications.  Please see handouts given to you by your recovery  nurse.  ACTIVITY:  You should plan to take it easy for the rest of today and you should NOT DRIVE or use heavy machinery until tomorrow (because of the sedation medicines used during the test).    FOLLOW UP: Our staff will call the number listed on your records 48-72 hours following your procedure to check on you and address any questions or concerns that you may have regarding the information given to you following your procedure. If we do not reach you, we will leave a message.  We will attempt to reach you two times.  During this call, we will ask if you have developed any symptoms of COVID 19. If you develop any symptoms (ie: fever, flu-like symptoms, shortness of breath, cough etc.) before then, please call [phone].  If you test positive for Covid 19 in the 2 weeks post procedure, please call and report this information to us.    If any biopsies were taken you will be contacted by phone or by letter within the next 1-3 weeks.  Please call us at [phone] if you have not heard about the biopsies in 3 weeks.   Thank you for allowing us to provide for your healthcare needs today.   SIGNATURES/CONFIDENTIALITY: You and/or your care partner have signed paperwork which will be entered into your electronic medical record.  These signatures attest to the fact that that the information above on your After Visit Summary has been reviewed and is understood.    responsibility of the confidentiality of this discharge information lies with you and/or your care-partner.

## 2021-01-03 NOTE — Progress Notes (Signed)
Called to room to assist during endoscopic procedure.  Patient ID and intended procedure confirmed with present staff. Received instructions for my participation in the procedure from the performing physician.  

## 2021-01-03 NOTE — Progress Notes (Signed)
pt tolerated well. VSS. awake and to recovery. Report given to RN.  

## 2021-01-03 NOTE — Op Note (Signed)
[facility] Patient Name: Darin Hunter Procedure Date: 01/03/2021 1:56 PM MRN: [id number] Endoscopist: Wilhemina Bonito. Marina Goodell , MD Age: 65 Referring MD:  Date of Birth: 06/16/1956 Gender: Male Account #: [id number] Procedure:                Colonoscopy with cold snare polypectomy x 1 Indications:              High risk colon cancer surveillance: Personal                            history of multiple (3 or more) adenomas. Previous                            examinations 2005, 2008, 2013, 2018. Mother with                            colon cancer in 44s Medicines:                Monitored Anesthesia Care Procedure:                Pre-Anesthesia Assessment:                           - Prior to the procedure, a History and Physical                            was performed, and patient medications and                            allergies were reviewed. The patient's tolerance of                            previous anesthesia was also reviewed. The risks                            and benefits of the procedure and the sedation                            options and risks were discussed with the patient.                            All questions were answered, and informed consent                            was obtained. Prior Anticoagulants: The patient has                            taken no previous anticoagulant or antiplatelet                            agents. ASA Grade Assessment: II - A patient with                            mild systemic disease. After reviewing the risks  and benefits, the patient was deemed in                            satisfactory condition to undergo the procedure.                           After obtaining informed consent, the colonoscope                            was passed under direct vision. Throughout the                            procedure, the patient's blood pressure, pulse, and                            oxygen saturations  were monitored continuously. The                            Olympus CF-HQ190L [id number]) Colonoscope was                            introduced through the anus and advanced to the the                            cecum, identified by appendiceal orifice and                            ileocecal valve. The ileocecal valve, appendiceal                            orifice, and rectum were photographed. The quality                            of the bowel preparation was excellent. The                            colonoscopy was performed without difficulty. The                            patient tolerated the procedure well. The bowel                            preparation used was SUPREP via split dose                            instruction. Scope In: 2:31:19 PM Scope Out: 2:44:24 PM Scope Withdrawal Time: 0 hours 11 minutes 3 seconds  Total Procedure Duration: 0 hours 13 minutes 5 seconds  Findings:                 A 3 mm polyp was found in the sigmoid colon. The                            polyp was removed with a cold snare. Resection and  retrieval were complete.                           Internal hemorrhoids were found during retroflexion.                           The exam was otherwise without abnormality on                            direct and retroflexion views. Complications:            No immediate complications. Estimated blood loss:                            None. Estimated Blood Loss:     Estimated blood loss: none. Impression:               - One 3 mm polyp in the sigmoid colon, removed with                            a cold snare. Resected and retrieved.                           - Internal hemorrhoids.                           - The examination was otherwise normal on direct                            and retroflexion views. Recommendation:           - Repeat colonoscopy in 5 years for surveillance.                           - Patient has a contact  number available for                            emergencies. The signs and symptoms of potential                            delayed complications were discussed with the                            patient. Return to normal activities tomorrow.                            Written discharge instructions were provided to the                            patient.                           - Resume previous diet.                           - Continue present medications.                           -  Await pathology results. Wilhemina Bonito. Marina Goodell, MD 01/03/2021 2:50:00 PM This report has been signed electronically.

## 2021-01-03 NOTE — Progress Notes (Signed)
Pt's states no medical or surgical changes since previsit or office visit.  SH IV.

## 2021-01-05 ENCOUNTER — Telehealth: Payer: Self-pay

## 2021-01-05 NOTE — Telephone Encounter (Signed)
  Follow up Call-  Call back number 01/03/2021  Post procedure Call Back phone  # [phone]  Permission to leave phone message Yes  Some recent data might be hidden     Patient questions:  Do you have a fever, pain , or abdominal swelling? No. Pain Score  0 *  Have you tolerated food without any problems? Yes.    Have you been able to return to your normal activities? Yes.    Do you have any questions about your discharge instructions: Diet   No. Medications  No. Follow up visit  No.  Do you have questions or concerns about your Care? No.  Actions: * If pain score is 4 or above: No action needed, pain <4.  Have you developed a fever since your procedure? No 2.   Have you had an respiratory symptoms (SOB or cough) since your procedure? No  3.   Have you tested positive for COVID 19 since your procedure No 4.   Have you had any family members/close contacts diagnosed with the COVID 19 since your procedure?  No   If yes to any of these questions please route to Joylene John, RN and Joella Prince, RN

## 2021-01-09 ENCOUNTER — Encounter: Payer: Self-pay | Admitting: Internal Medicine

## 2021-05-10 ENCOUNTER — Other Ambulatory Visit: Payer: Self-pay | Admitting: *Deleted

## 2021-05-10 DIAGNOSIS — Z87891 Personal history of nicotine dependence: Secondary | ICD-10-CM

## 2021-05-10 DIAGNOSIS — F17210 Nicotine dependence, cigarettes, uncomplicated: Secondary | ICD-10-CM

## 2021-12-17 IMAGING — CT CT CHEST LUNG CANCER SCREENING LOW DOSE W/O CM
1 series · 10 of 10 positions shown, 13 images · non-contrast
Comparison: 04/02/2018

CLINICAL DATA: Lung cancer screening. One hundred six pack-year
history. Current asymptomatic smoker.

EXAM:
CT CHEST WITHOUT CONTRAST LOW-DOSE FOR LUNG CANCER SCREENING
TECHNIQUE: Multidetector CT imaging of the chest was performed following the
standard protocol without IV contrast.

[ct lung segmentation data · axial · 0.76mm/px · z∈[+1212,+1212]mm · 10 of 330 frames shown]
[frame 1/330  mediastinal]
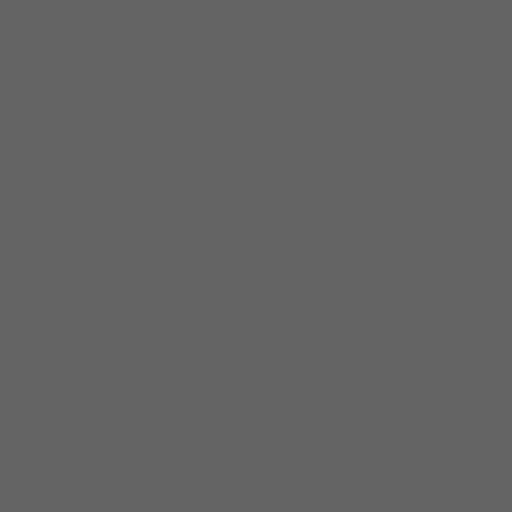
[frame 1/330  lung]
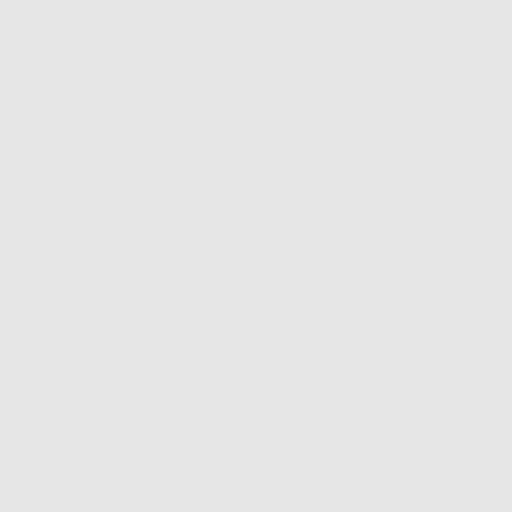
[frame 37/330  lung]
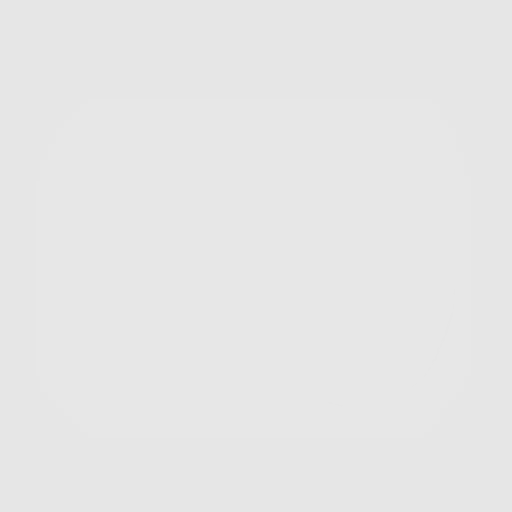
[frame 74/330  lung]
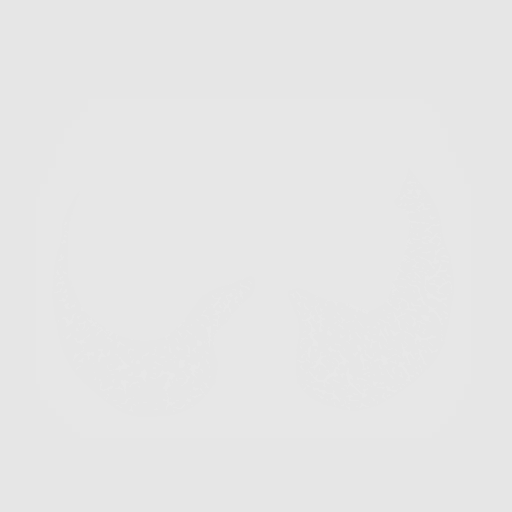
[frame 110/330  lung]
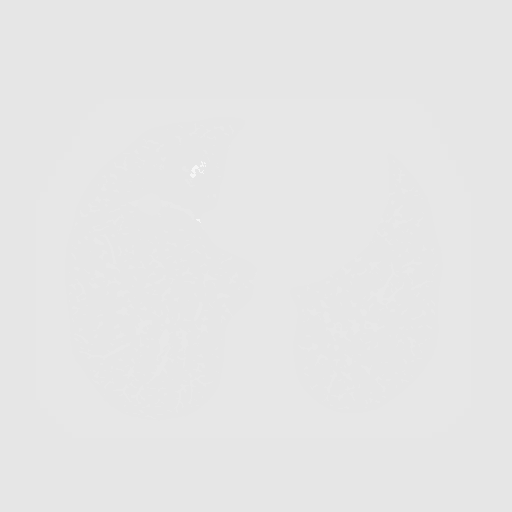
[frame 147/330  mediastinal]
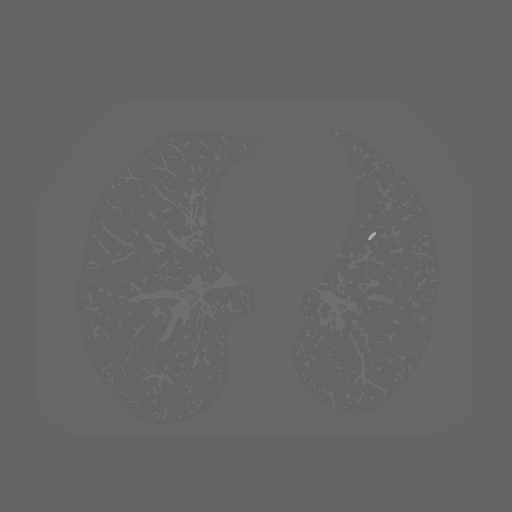
[frame 147/330  lung]
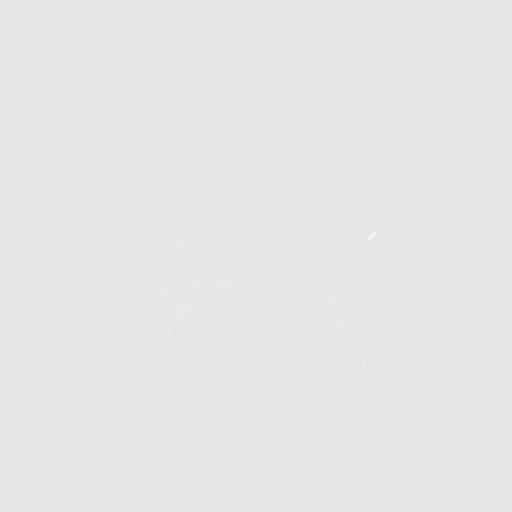
[frame 183/330  lung]
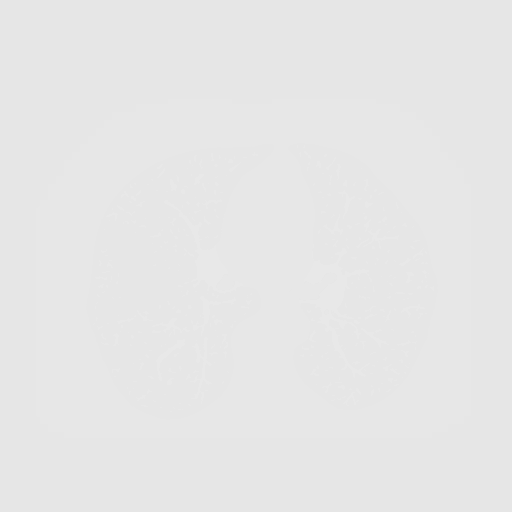
[frame 220/330  lung]
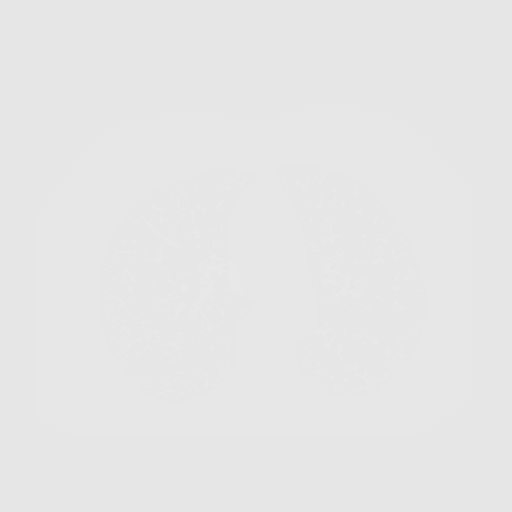
[frame 256/330  lung]
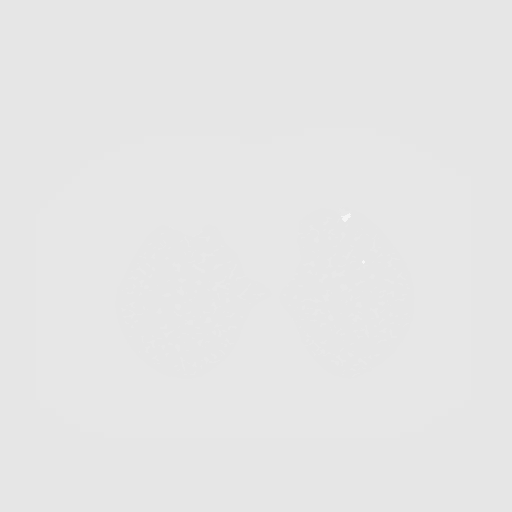
[frame 293/330  mediastinal]
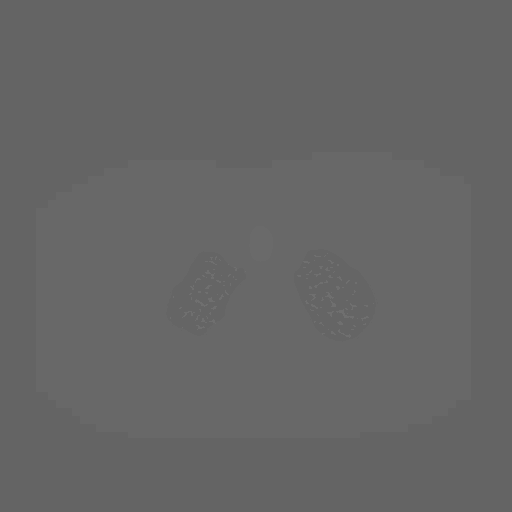
[frame 293/330  lung]
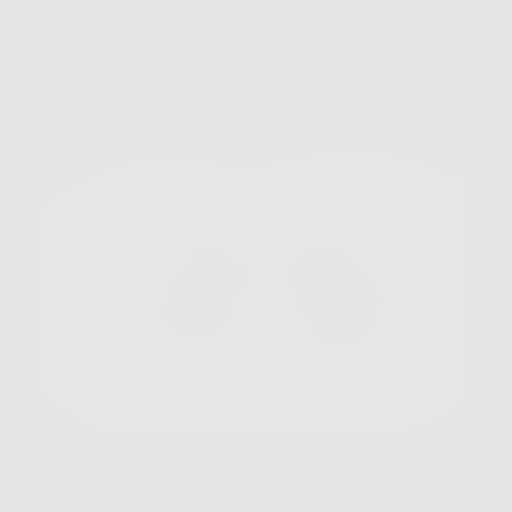
[frame 330/330  lung]
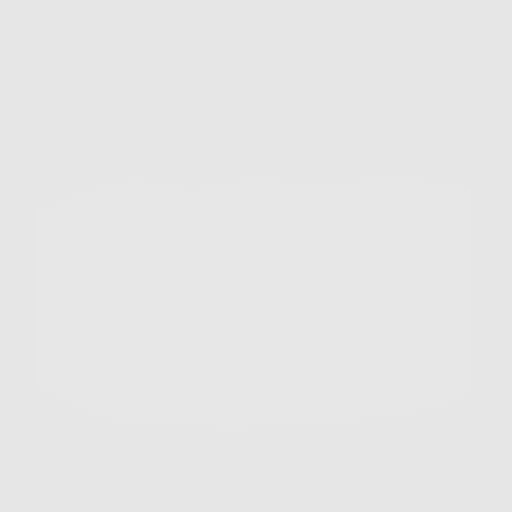

[10 of 10 positions shown; findings below may reference images not displayed]

FINDINGS: Cardiovascular: The heart size appears within normal limits. Aortic
atherosclerosis. RCA, LAD and left circumflex coronary artery
calcifications.

Mediastinum/Nodes: No enlarged mediastinal, hilar, or axillary lymph
nodes. Thyroid gland, trachea, and esophagus demonstrate no
significant findings.

Lungs/Pleura: Mild centrilobular and paraseptal emphysema
identified. No pleural effusion, airspace consolidation or
atelectasis. Scattered lung nodules are again noted. The largest is
in the perifissural left midlung with an equivalent diameter of
mm. These are not significantly changed when compared with previous
exam. No new lung nodules.

Upper Abdomen: No acute abnormality.

Musculoskeletal: Multilevel thoracic degenerative disc disease. No
acute or suspicious osseous findings.
IMPRESSION: 1. Lung-RADS 2, benign appearance or behavior. Continue annual
screening with low-dose chest CT without contrast in 12 months.
2. Coronary artery calcifications

Aortic Atherosclerosis (F66J6-K3B.B) and Emphysema ([id number]).

## 2022-11-01 ENCOUNTER — Telehealth: Payer: Self-pay

## 2022-11-01 NOTE — Telephone Encounter (Signed)
Patient's wife Nunzio Cory called to discuss the costs for scheduling annual LDCT.  Last time patient completed the LDCT, he was billed about $1,000.  She was unclear what the reason was at that time due to his last scan was in 2021 and she doesn't recall. Given the CPT code for the LDCT to check with patient's insurance plan to discuss what the out-of-pocket expense would be.  Will call us to schedule once they determine if patient can afford this CT.

## 2022-11-05 ENCOUNTER — Other Ambulatory Visit: Payer: Self-pay | Admitting: *Deleted

## 2022-11-05 DIAGNOSIS — Z122 Encounter for screening for malignant neoplasm of respiratory organs: Secondary | ICD-10-CM

## 2022-11-05 DIAGNOSIS — F17210 Nicotine dependence, cigarettes, uncomplicated: Secondary | ICD-10-CM

## 2022-11-05 DIAGNOSIS — Z87891 Personal history of nicotine dependence: Secondary | ICD-10-CM

## 2022-11-19 ENCOUNTER — Ambulatory Visit (HOSPITAL_BASED_OUTPATIENT_CLINIC_OR_DEPARTMENT_OTHER)
Admission: RE | Admit: 2022-11-19 | Discharge: 2022-11-19 | Disposition: A | Discharge status: Home / Self Care | Payer: Commercial Managed Care - PPO | Source: Ambulatory Visit | Attending: Acute Care | Admitting: Acute Care

## 2022-11-19 DIAGNOSIS — Z122 Encounter for screening for malignant neoplasm of respiratory organs: Secondary | ICD-10-CM | POA: Diagnosis present

## 2022-11-19 DIAGNOSIS — F17210 Nicotine dependence, cigarettes, uncomplicated: Secondary | ICD-10-CM | POA: Insufficient documentation

## 2022-11-19 DIAGNOSIS — Z87891 Personal history of nicotine dependence: Secondary | ICD-10-CM | POA: Insufficient documentation

## 2022-11-22 ENCOUNTER — Other Ambulatory Visit: Payer: Self-pay

## 2022-11-22 DIAGNOSIS — Z87891 Personal history of nicotine dependence: Secondary | ICD-10-CM

## 2022-11-22 DIAGNOSIS — F17210 Nicotine dependence, cigarettes, uncomplicated: Secondary | ICD-10-CM

## 2022-11-22 DIAGNOSIS — Z122 Encounter for screening for malignant neoplasm of respiratory organs: Secondary | ICD-10-CM

## 2023-11-21 ENCOUNTER — Ambulatory Visit (HOSPITAL_BASED_OUTPATIENT_CLINIC_OR_DEPARTMENT_OTHER)
Admission: RE | Admit: 2023-11-21 | Discharge: 2023-11-21 | Disposition: A | Discharge status: Home / Self Care | Payer: Medicare Other | Source: Ambulatory Visit | Attending: Acute Care | Admitting: Acute Care

## 2023-11-21 DIAGNOSIS — F17210 Nicotine dependence, cigarettes, uncomplicated: Secondary | ICD-10-CM | POA: Diagnosis present

## 2023-11-21 DIAGNOSIS — Z122 Encounter for screening for malignant neoplasm of respiratory organs: Secondary | ICD-10-CM | POA: Diagnosis present

## 2023-11-21 DIAGNOSIS — Z87891 Personal history of nicotine dependence: Secondary | ICD-10-CM | POA: Diagnosis present

## 2024-01-13 ENCOUNTER — Other Ambulatory Visit: Payer: Self-pay | Admitting: *Deleted

## 2024-01-13 DIAGNOSIS — Z122 Encounter for screening for malignant neoplasm of respiratory organs: Secondary | ICD-10-CM

## 2024-01-13 DIAGNOSIS — Z87891 Personal history of nicotine dependence: Secondary | ICD-10-CM

## 2024-01-13 DIAGNOSIS — F17210 Nicotine dependence, cigarettes, uncomplicated: Secondary | ICD-10-CM

## 2024-11-22 ENCOUNTER — Ambulatory Visit (HOSPITAL_BASED_OUTPATIENT_CLINIC_OR_DEPARTMENT_OTHER)
Admission: RE | Admit: 2024-11-22 | Discharge: 2024-11-22 | Disposition: A | Discharge status: Home / Self Care | Source: Ambulatory Visit | Attending: Acute Care | Admitting: Acute Care

## 2024-11-22 DIAGNOSIS — F17210 Nicotine dependence, cigarettes, uncomplicated: Secondary | ICD-10-CM | POA: Diagnosis present

## 2024-11-22 DIAGNOSIS — Z122 Encounter for screening for malignant neoplasm of respiratory organs: Secondary | ICD-10-CM | POA: Diagnosis present

## 2024-11-22 DIAGNOSIS — Z87891 Personal history of nicotine dependence: Secondary | ICD-10-CM | POA: Insufficient documentation

## 2024-12-01 ENCOUNTER — Other Ambulatory Visit: Payer: Self-pay

## 2024-12-01 DIAGNOSIS — Z122 Encounter for screening for malignant neoplasm of respiratory organs: Secondary | ICD-10-CM

## 2024-12-01 DIAGNOSIS — F17210 Nicotine dependence, cigarettes, uncomplicated: Secondary | ICD-10-CM

## 2024-12-01 DIAGNOSIS — Z87891 Personal history of nicotine dependence: Secondary | ICD-10-CM
